# Patient Record
Sex: Male | Born: 1985
Health system: Southern US, Community
[De-identification: ages and names within clinical notes are randomized; demographics above are authoritative.]

## PROBLEM LIST (undated history)

## (undated) DIAGNOSIS — J309 Allergic rhinitis, unspecified: Secondary | ICD-10-CM

## (undated) HISTORY — DX: Allergic rhinitis, unspecified: J30.9

---

## 2011-09-23 ENCOUNTER — Emergency Department (HOSPITAL_COMMUNITY)
Admission: EM | Admit: 2011-09-23 | Discharge: 2011-09-24 | Disposition: A | Discharge status: Home / Self Care | Payer: Self-pay | Attending: Emergency Medicine | Admitting: Emergency Medicine

## 2011-09-23 DIAGNOSIS — W260XXA Contact with knife, initial encounter: Secondary | ICD-10-CM | POA: Insufficient documentation

## 2011-09-23 DIAGNOSIS — S61218A Laceration without foreign body of other finger without damage to nail, initial encounter: Secondary | ICD-10-CM

## 2011-09-23 DIAGNOSIS — S61209A Unspecified open wound of unspecified finger without damage to nail, initial encounter: Secondary | ICD-10-CM | POA: Insufficient documentation

## 2011-09-23 DIAGNOSIS — F17200 Nicotine dependence, unspecified, uncomplicated: Secondary | ICD-10-CM | POA: Insufficient documentation

## 2011-09-24 ENCOUNTER — Emergency Department (HOSPITAL_COMMUNITY): Payer: Self-pay

## 2011-09-24 ENCOUNTER — Encounter (HOSPITAL_COMMUNITY): Payer: Self-pay | Admitting: *Deleted

## 2011-09-24 MED ORDER — ONDANSETRON 4 MG PO TBDP
4.0000 mg | ORAL_TABLET | Freq: Once | ORAL | Status: AC
  Administered 2011-09-24: 4 mg via ORAL

## 2011-09-24 MED ORDER — ONDANSETRON 4 MG PO TBDP
ORAL_TABLET | ORAL | Status: AC
  Filled 2011-09-24: qty 1

## 2011-09-24 NOTE — Discharge Instructions (Signed)
Finger Avulsion  When the tip of the finger is lost, a new nail may grow back if part of the fingernail is left. The new nail may be deformed. If just the tip of the finger is lost, no repair may be needed unless there is bone showing. If bone is showing, your caregiver may need to remove the protruding bone and put on a bandage. Your caregiver will do what is best for you. Most of the time when a fingertip is lost, the end will gradually grow back on and look fairly normal, but it may remain sensitive to pressure and temperature extremes for a long time. HOME CARE INSTRUCTIONS   Keep your hand elevated above your heart to relieve pain and swelling.   Keep your dressing dry and clean.   Change your bandage in 24 hours or as directed.   After your bandage is changed, soak your hand in warm soapy water for 10 to 15 minutes. Do this 3 times per day. This helps reduce pain and swelling.   After soaking your hand, apply a clean, dry bandage. Change your bandage if it is wet or dirty.   Only take over-the-counter or prescription medicines for pain, discomfort, or fever as directed by your caregiver.   See your caregiver as needed for problems.  SEEK MEDICAL CARE IF:   You have increased pain, swelling, drainage, or bleeding.   You have a fever.   You have swelling that spreads from your finger and into your hand.  Make sure to check to see if you need a tetanus booster. Document Released: 06/10/2001 Document Revised: 03/21/2011 Document Reviewed: 05/05/2008    Follow up with Dr. Mina Marble - call his office this week - do not remove the splint or flex the tip of the finger.  Return here sooner with any other concerns.

## 2011-09-24 NOTE — ED Notes (Signed)
Laceration to the  Rt index finger he did with a pocket knife.  Minimal bleeding

## 2011-09-24 NOTE — ED Provider Notes (Signed)
History     CSN: [id number]  Arrival date & time 09/23/11  2346   First MD Initiated Contact with Patient 09/24/11 0100      Chief Complaint  Patient presents with  . Laceration    (Consider location/radiation/quality/duration/timing/severity/associated sxs/prior treatment) HPI Comments: Patient here with laceration to right index finger at the DIP joint - hemostatic - laceration by his own pocket knife - is unable to completely extend from DIP - reports decrease in sensation distal to the injury.  Patient is a 26 y.o. male presenting with skin laceration. The history is provided by the patient. No language interpreter was used.  Laceration  The incident occurred 1 to 2 hours ago. The laceration is located on the right hand. The laceration is 2 cm in size. The laceration mechanism was a a clean knife. The pain is at a severity of 5/10. The pain is moderate. The pain has been constant since onset. He reports no foreign bodies present. His tetanus status is UTD.    History reviewed. No pertinent past medical history.  History reviewed. No pertinent past surgical history.  No family history on file.  History  Substance Use Topics  . Smoking status: Current Everyday Smoker  . Smokeless tobacco: Not on file  . Alcohol Use: No      Review of Systems  Constitutional: Negative for fever and chills.  HENT: Negative for neck pain.   Eyes: Negative for pain.  Respiratory: Negative for chest tightness and shortness of breath.   Cardiovascular: Negative for chest pain.  Gastrointestinal: Negative for nausea, vomiting and abdominal pain.  Genitourinary: Negative for flank pain.  Musculoskeletal: Negative for back pain.  Skin: Positive for wound.  Neurological: Negative for dizziness and headaches.  All other systems reviewed and are negative.    Allergies  Review of patient's allergies indicates no known allergies.  Home Medications   Current Outpatient Rx  Name Route Sig  Dispense Refill  . BUTALBITAL-ASPIRIN-CAFFEINE 50-325-40 MG PO CAPS Oral Take 1-2 capsules by mouth every 4 (four) hours as needed. As needed for headaches. Do not exceed more than 6 capsules per day.    Marland Kitchen LANSOPRAZOLE 15 MG PO CPDR Oral Take 15 mg by mouth daily as needed. As needed for reflux.      BP 139/81  Pulse 88  Temp(Src) 97.7 F (36.5 C) (Oral)  Resp 19  SpO2 95%  Physical Exam  Nursing note and vitals reviewed. Constitutional: He is oriented to person, place, and time. He appears well-developed and well-nourished. No distress.  HENT:  Head: Normocephalic and atraumatic.  Right Ear: External ear normal.  Left Ear: External ear normal.  Nose: Nose normal.  Mouth/Throat: Oropharynx is clear and moist. No oropharyngeal exudate.  Eyes: Conjunctivae are normal. Pupils are equal, round, and reactive to light. No scleral icterus.  Neck: Normal range of motion. Neck supple.  Cardiovascular: Normal rate, regular rhythm and normal heart sounds.  Exam reveals no gallop and no friction rub.   No murmur heard. Pulmonary/Chest: Effort normal and breath sounds normal. No respiratory distress. He has no wheezes. He has no rales. He exhibits no tenderness.  Abdominal: Soft. Bowel sounds are normal. He exhibits no distension. There is no tenderness.  Musculoskeletal: He exhibits tenderness. He exhibits no edema.       1cm hemostatic laceration to dorsum of right index finger at the DIP - sensation dulled distal to injury, able to distinguish pin prick, flexion intact at DIP, unable to extend.  Lymphadenopathy:    He has no cervical adenopathy.  Neurological: He is alert and oriented to person, place, and time. No cranial nerve deficit. He exhibits normal muscle tone. Coordination normal.  Skin: Skin is warm and dry. No rash noted. No erythema. No pallor.  Psychiatric: He has a normal mood and affect. His behavior is normal. Judgment and thought content normal.    ED Course  LACERATION  REPAIR Date/Time: 09/24/2011 2:25 AM Performed by: Marisue Humble, Beryl Hornberger C. Authorized by: Patrecia Pour Consent: Verbal consent obtained. Written consent not obtained. Risks and benefits: risks, benefits and alternatives were discussed Consent given by: patient Patient understanding: patient states understanding of the procedure being performed Patient consent: the patient's understanding of the procedure does not match consent given Procedure consent: procedure consent does not match procedure scheduled Relevant documents: relevant documents not present or verified Test results: test results not available Site marked: the operative site was not marked Imaging studies: imaging studies not available Patient identity confirmed: verbally with patient and arm band Time out: Immediately prior to procedure a "time out" was called to verify the correct patient, procedure, equipment, support staff and site/side marked as required. Body area: upper extremity Location details: right index finger Laceration length: 2 cm Contamination: The wound is contaminated. Foreign bodies: no foreign bodies Tendon involvement: superficial Nerve involvement: superficial Vascular damage: no Anesthesia: digital block Local anesthetic: lidocaine 1% without epinephrine Anesthetic total: 6 ml Patient sedated: no Preparation: Patient was prepped and draped in the usual sterile fashion. Irrigation solution: saline Irrigation method: jet lavage Amount of cleaning: extensive Debridement: none Degree of undermining: none Skin closure: 5-0 nylon Number of sutures: 5 Approximation: close Approximation difficulty: simple Dressing: antibiotic ointment, splint and 4x4 sterile gauze Patient tolerance: Patient tolerated the procedure well with no immediate complications. Comments: Finger splinted in extension - tournicot used during irrigation and laceration repair - total time on 30 minutes   (including critical  care time)  Labs Reviewed - No data to display Dg Finger Index Right  09/24/2011  *RADIOLOGY REPORT*  Clinical Data: Right second finger laceration  RIGHT INDEX FINGER 2+V  Comparison: None.  Findings: No acute fracture or dislocation.  No aggressive osseous lesion.  No radiopaque foreign body. Laceration along the radial- dorsal surface at the DIP joint.  IMPRESSION: No acute osseous abnormality.  Laceration along the dorsal surface at the DIP joint.  Original Report Authenticated By: Waneta Martins, M.D.     Right index finger laceration with extensor tendon laceration   MDM  Patient here with laceration to right index finger with extensor tendon laceration - plan to splint in extension and he will follow up with Dr. Mina Marble.  I have instructed the patient not to remove the finger splint for any reason and with the importance of follow up.  He agrees.  Tetanus is UTD and the knife was a clean knife so I do not think there is a need for antibiotics        Scarlette Calico C. [place], [place] 09/24/11 0231

## 2011-09-24 NOTE — ED Notes (Signed)
Pt refused x-ray and told radiology that he did not think finger was broke.

## 2011-09-25 NOTE — ED Provider Notes (Signed)
Medical screening examination/treatment/procedure(s) were performed by non-physician practitioner and as supervising physician I was immediately available for consultation/collaboration.    Vida Roller, MD 09/25/11 [phone]

## 2012-01-22 DIAGNOSIS — G8929 Other chronic pain: Secondary | ICD-10-CM | POA: Insufficient documentation

## 2012-01-22 DIAGNOSIS — Z720 Tobacco use: Secondary | ICD-10-CM | POA: Insufficient documentation

## 2012-01-22 DIAGNOSIS — G43909 Migraine, unspecified, not intractable, without status migrainosus: Secondary | ICD-10-CM | POA: Insufficient documentation

## 2013-02-11 DIAGNOSIS — J309 Allergic rhinitis, unspecified: Secondary | ICD-10-CM

## 2013-02-11 HISTORY — DX: Allergic rhinitis, unspecified: J30.9

## 2014-02-27 ENCOUNTER — Encounter: Payer: Self-pay | Admitting: *Deleted

## 2014-02-27 ENCOUNTER — Emergency Department (INDEPENDENT_AMBULATORY_CARE_PROVIDER_SITE_OTHER)
Admission: EM | Admit: 2014-02-27 | Discharge: 2014-02-27 | Disposition: A | Discharge status: Home / Self Care | Payer: Medicaid Other | Source: Home / Self Care | Attending: Family Medicine | Admitting: Family Medicine

## 2014-02-27 DIAGNOSIS — M546 Pain in thoracic spine: Secondary | ICD-10-CM

## 2014-02-27 MED ORDER — CYCLOBENZAPRINE HCL 10 MG PO TABS: 10.0000 mg | ORAL_TABLET | Freq: Every evening | ORAL | Status: DC | PRN

## 2014-02-27 MED ORDER — DICLOFENAC SODIUM 50 MG PO TBEC: 50.0000 mg | DELAYED_RELEASE_TABLET | Freq: Two times a day (BID) | ORAL | Status: DC | PRN

## 2014-02-27 MED ORDER — TRAMADOL HCL 50 MG PO TABS: 50.0000 mg | ORAL_TABLET | Freq: Four times a day (QID) | ORAL | Status: DC | PRN

## 2014-02-27 NOTE — ED Provider Notes (Signed)
Howard Fritz is a 28 y.o. male who presents to Urgent Care today for Left back pain. Patient strained his left thoracic back yesterday while lifting a truck frame. The pain is moderate to severe and worse with activity. He has tried Aleve which helps a little. He denies any radiating pain weakness or numbness. No fevers or chills. No neck pain. He feels well otherwise.   History reviewed. No pertinent past medical history. History reviewed. No pertinent past surgical history. History  Substance Use Topics  . Smoking status: Current Every Day Smoker  . Smokeless tobacco: Not on file  . Alcohol Use: No   ROS as above Medications: No current facility-administered medications for this encounter.   Current Outpatient Prescriptions  Medication Sig Dispense Refill  . butalbital-aspirin-caffeine (FIORINAL) 50-325-40 MG per capsule Take 1-2 capsules by mouth every 4 (four) hours as needed. As needed for headaches. Do not exceed more than 6 capsules per day.    . cyclobenzaprine (FLEXERIL) 10 MG tablet Take 1 tablet (10 mg total) by mouth at bedtime as needed for muscle spasms. 20 tablet 0  . diclofenac (VOLTAREN) 50 MG EC tablet Take 1 tablet (50 mg total) by mouth 2 (two) times daily as needed. 60 tablet 0  . lansoprazole (PREVACID) 15 MG capsule Take 15 mg by mouth daily as needed. As needed for reflux.    . traMADol (ULTRAM) 50 MG tablet Take 1 tablet (50 mg total) by mouth every 6 (six) hours as needed. 15 tablet 0   No Known Allergies   Exam:  BP 125/72 mmHg  Pulse 62  Temp(Src) 98 F (36.7 C) (Oral)  Ht 5\' 9"  (1.753 m)  Wt 187 lb 8 oz (85.049 kg)  BMI 27.68 kg/m2  SpO2 98% Gen: Well NAD Back: Nontender along spinal midline from C-spine to L-spine. Tender palpation thoracic paraspinal and latissimus area on the left side. Normal shoulder range of motion. Upper extremity strength reflexes and sensation are intact Lower extremity reflexes are intact and equal throughout. Normal  gait. Back range of motion is normal however patient expresses pain with left rotation  No results found for this or any previous visit (from the past 24 hour(s)). No results found.  Assessment and Plan: 28 y.o. male with thoracic muscle strain. Treatment with NSAIDs and muscle relaxers tramadol heating pad and home exercise program.  Discussed warning signs or symptoms. Please see discharge instructions. Patient expresses understanding.     Rodolph BongEvan S Nieves Chapa, MD 02/27/14 [phone]

## 2014-02-27 NOTE — Discharge Instructions (Signed)
Thank you for coming in today. Come back or go to the emergency room if you notice new weakness new numbness problems walking or bowel or bladder problems.' Use a heating pad.    Back Exercises These exercises may help you when beginning to rehabilitate your injury. Your symptoms may resolve with or without further involvement from your physician, physical therapist or athletic trainer. While completing these exercises, remember:   Restoring tissue flexibility helps normal motion to return to the joints. This allows healthier, less painful movement and activity.  An effective stretch should be held for at least 30 seconds.  A stretch should never be painful. You should only feel a gentle lengthening or release in the stretched tissue. STRETCH - Extension, Prone on Elbows   Lie on your stomach on the floor, a bed will be too soft. Place your palms about shoulder width apart and at the height of your head.  Place your elbows under your shoulders. If this is too painful, stack pillows under your chest.  Allow your body to relax so that your hips drop lower and make contact more completely with the floor.  Hold this position for __________ seconds.  Slowly return to lying flat on the floor. Repeat __________ times. Complete this exercise __________ times per day.  RANGE OF MOTION - Extension, Prone Press Ups   Lie on your stomach on the floor, a bed will be too soft. Place your palms about shoulder width apart and at the height of your head.  Keeping your back as relaxed as possible, slowly straighten your elbows while keeping your hips on the floor. You may adjust the placement of your hands to maximize your comfort. As you gain motion, your hands will come more underneath your shoulders.  Hold this position __________ seconds.  Slowly return to lying flat on the floor. Repeat __________ times. Complete this exercise __________ times per day.  RANGE OF MOTION- Quadruped, Neutral Spine    Assume a hands and knees position on a firm surface. Keep your hands under your shoulders and your knees under your hips. You may place padding under your knees for comfort.  Drop your head and point your tail bone toward the ground below you. This will round out your low back like an angry cat. Hold this position for __________ seconds.  Slowly lift your head and release your tail bone so that your back sags into a large arch, like an old horse.  Hold this position for __________ seconds.  Repeat this until you feel limber in your low back.  Now, find your "sweet spot." This will be the most comfortable position somewhere between the two previous positions. This is your neutral spine. Once you have found this position, tense your stomach muscles to support your low back.  Hold this position for __________ seconds. Repeat __________ times. Complete this exercise __________ times per day.  STRETCH - Flexion, Single Knee to Chest   Lie on a firm bed or floor with both legs extended in front of you.  Keeping one leg in contact with the floor, bring your opposite knee to your chest. Hold your leg in place by either grabbing behind your thigh or at your knee.  Pull until you feel a gentle stretch in your low back. Hold __________ seconds.  Slowly release your grasp and repeat the exercise with the opposite side. Repeat __________ times. Complete this exercise __________ times per day.  STRETCH - Hamstrings, Standing  Stand or sit and  extend your right / left leg, placing your foot on a chair or foot stool  Keeping a slight arch in your low back and your hips straight forward.  Lead with your chest and lean forward at the waist until you feel a gentle stretch in the back of your right / left knee or thigh. (When done correctly, this exercise requires leaning only a small distance.)  Hold this position for __________ seconds. Repeat __________ times. Complete this stretch __________  times per day. STRENGTHENING - Deep Abdominals, Pelvic Tilt   Lie on a firm bed or floor. Keeping your legs in front of you, bend your knees so they are both pointed toward the ceiling and your feet are flat on the floor.  Tense your lower abdominal muscles to press your low back into the floor. This motion will rotate your pelvis so that your tail bone is scooping upwards rather than pointing at your feet or into the floor.  With a gentle tension and even breathing, hold this position for __________ seconds. Repeat __________ times. Complete this exercise __________ times per day.  STRENGTHENING - Abdominals, Crunches   Lie on a firm bed or floor. Keeping your legs in front of you, bend your knees so they are both pointed toward the ceiling and your feet are flat on the floor. Cross your arms over your chest.  Slightly tip your chin down without bending your neck.  Tense your abdominals and slowly lift your trunk high enough to just clear your shoulder blades. Lifting higher can put excessive stress on the low back and does not further strengthen your abdominal muscles.  Control your return to the starting position. Repeat __________ times. Complete this exercise __________ times per day.  STRENGTHENING - Quadruped, Opposite UE/LE Lift   Assume a hands and knees position on a firm surface. Keep your hands under your shoulders and your knees under your hips. You may place padding under your knees for comfort.  Find your neutral spine and gently tense your abdominal muscles so that you can maintain this position. Your shoulders and hips should form a rectangle that is parallel with the floor and is not twisted.  Keeping your trunk steady, lift your right hand no higher than your shoulder and then your left leg no higher than your hip. Make sure you are not holding your breath. Hold this position __________ seconds.  Continuing to keep your abdominal muscles tense and your back steady,  slowly return to your starting position. Repeat with the opposite arm and leg. Repeat __________ times. Complete this exercise __________ times per day. Document Released: 04/19/2005 Document Revised: 06/24/2011 Document Reviewed: 07/14/2008 [facility] Patient Information 2015 [place], [place]. This information is not intended to replace advice given to you by your health care provider. Make sure you discuss any questions you have with your health care provider.

## 2014-02-27 NOTE — ED Notes (Signed)
Pt was lifting and pulling items from behind yesterday and felt a lot of pain like he pulled something on his L side.  Has decreased range of motion and can barely turn his body.  Denies pain when turning head.  Pain is localized to L side and back.  7/10 pain.

## 2014-03-02 ENCOUNTER — Encounter: Payer: Self-pay | Admitting: *Deleted

## 2014-03-02 ENCOUNTER — Emergency Department
Admission: EM | Admit: 2014-03-02 | Discharge: 2014-03-02 | Disposition: A | Discharge status: Home / Self Care | Payer: Medicaid Other | Source: Home / Self Care | Attending: Family Medicine | Admitting: Family Medicine

## 2014-03-02 ENCOUNTER — Emergency Department (INDEPENDENT_AMBULATORY_CARE_PROVIDER_SITE_OTHER): Payer: Medicaid Other

## 2014-03-02 DIAGNOSIS — S29011D Strain of muscle and tendon of front wall of thorax, subsequent encounter: Secondary | ICD-10-CM

## 2014-03-02 DIAGNOSIS — R0781 Pleurodynia: Secondary | ICD-10-CM

## 2014-03-02 DIAGNOSIS — S2341XD Sprain of ribs, subsequent encounter: Secondary | ICD-10-CM

## 2014-03-02 NOTE — Discharge Instructions (Signed)
Wear rib belt daytime.  Apply ice pack for 15 to 20 minutes, 2 to 3 times daily  Continue until pain decreases.  Continue medications.

## 2014-03-02 NOTE — ED Provider Notes (Signed)
CSN: [id number]     Arrival date & time 03/02/14  1342 History   First MD Initiated Contact with Patient 03/02/14 1400     Chief Complaint  Patient presents with  . Back Pain      HPI Comments: Patient was evaluated 3 days ago with left thoracic back strain.  He reports that there has not been any improvement and he has now developed pain in his left lateral chest, worse with movement and inspiration.  He denies shortness of breath   Patient is a 28 y.o. male presenting with chest pain. The history is provided by the patient.  Chest Pain Pain location:  L lateral chest Pain quality: sharp and stabbing   Pain radiates to:  Does not radiate Pain radiates to the back: yes   Pain severity:  Moderate Onset quality:  Gradual Duration:  3 days Timing:  Constant Progression:  Worsening Chronicity:  New Context: breathing, lifting, movement, raising an arm, at rest and trauma   Relieved by:  Nothing Worsened by:  Coughing, deep breathing and movement Ineffective treatments: NSAID and muscle relaxant. Associated symptoms: back pain   Associated symptoms: no abdominal pain, no cough, no diaphoresis, no fever, no palpitations and no shortness of breath     History reviewed. No pertinent past medical history. History reviewed. No pertinent past surgical history. History reviewed. No pertinent family history. History  Substance Use Topics  . Smoking status: Current Every Day Smoker  . Smokeless tobacco: Not on file  . Alcohol Use: No    Review of Systems  Constitutional: Negative for fever and diaphoresis.  Respiratory: Negative for cough and shortness of breath.   Cardiovascular: Positive for chest pain. Negative for palpitations.  Gastrointestinal: Negative for abdominal pain.  Musculoskeletal: Positive for back pain.  All other systems reviewed and are negative.   Allergies  Review of patient's allergies indicates no known allergies.  Home Medications   Prior to Admission  medications   Medication Sig Start Date End Date Taking? Authorizing Provider  butalbital-aspirin-caffeine [facility](FIORINAL) 50-325-40 MG per capsule Take 1-2 capsules by mouth every 4 (four) hours as needed. As needed for headaches. Do not exceed more than 6 capsules per day.    Historical Provider, MD  cyclobenzaprine (FLEXERIL) 10 MG tablet Take 1 tablet (10 mg total) by mouth at bedtime as needed for muscle spasms. 02/27/14   Rodolph BongEvan S Corey, MD  diclofenac (VOLTAREN) 50 MG EC tablet Take 1 tablet (50 mg total) by mouth 2 (two) times daily as needed. 02/27/14   Rodolph BongEvan S Corey, MD  lansoprazole (PREVACID) 15 MG capsule Take 15 mg by mouth daily as needed. As needed for reflux.    Historical Provider, MD  traMADol (ULTRAM) 50 MG tablet Take 1 tablet (50 mg total) by mouth every 6 (six) hours as needed. 02/27/14   Rodolph BongEvan S Corey, MD   BP 132/74 mmHg  Pulse 82  Temp(Src) 98.1 F (36.7 C) (Oral)  Resp 18  Ht 5\' 9"  (1.753 m)  Wt 188 lb (85.276 kg)  BMI 27.75 kg/m2  SpO2 98% Physical Exam  Constitutional: He is oriented to person, place, and time. He appears well-developed and well-nourished. No distress.  HENT:  Head: Atraumatic.  Eyes: Conjunctivae are normal. Pupils are equal, round, and reactive to light.  Neck: Neck supple.  Cardiovascular: Normal heart sounds.   Pulmonary/Chest: Breath sounds normal. No respiratory distress. He has no wheezes. He has no rales.   He exhibits tenderness.  Patient has tenderness over ribs and intercostal muscles left lateral chest as noted on diagram.    Abdominal: There is no tenderness.  Lymphadenopathy:    He has no cervical adenopathy.  Neurological: He is alert and oriented to person, place, and time.  Skin: Skin is warm and dry. No rash noted.  Nursing note and vitals reviewed.   ED Course  Procedures  none  Imaging Review Dg Ribs Unilateral W/chest Left  03/02/2014   CLINICAL DATA:  Left lower lateral rib pain for 4 days after injury 3 days  ago initial evaluation  EXAM: LEFT RIBS AND CHEST - 3+ VIEW  COMPARISON:  None.  FINDINGS: No fracture or other bone lesions are seen involving the ribs. There is no evidence of pneumothorax or pleural effusion. Both lungs are clear. Heart size and mediastinal contours are within normal limits.  IMPRESSION: Negative.   Electronically Signed   By: Esperanza Heiraymond  Rubner M.D.   On: 03/02/2014 14:40     MDM   1. Intercostal muscle strain, subsequent encounter   2. Rib pain on left side    Dispensed rib belt. Wear rib belt daytime.  Apply ice pack for 15 to 20 minutes, 2 to 3 times daily  Continue until pain decreases.  Continue medications. Followup with Dr. Rodney Langtonhomas Thekkekandam ([facility] if not improving about two weeks.     Lattie HawStephen A Chrisanna Mishra, MD 03/04/14 0700

## 2014-03-02 NOTE — ED Notes (Signed)
Howard Fritz reports that his LT side/ back pain is no better.

## 2014-10-29 ENCOUNTER — Emergency Department (INDEPENDENT_AMBULATORY_CARE_PROVIDER_SITE_OTHER)
Admission: EM | Admit: 2014-10-29 | Discharge: 2014-10-29 | Disposition: A | Discharge status: Home / Self Care | Payer: Medicaid Other | Source: Home / Self Care | Attending: Family Medicine | Admitting: Family Medicine

## 2014-10-29 DIAGNOSIS — L299 Pruritus, unspecified: Secondary | ICD-10-CM | POA: Diagnosis not present

## 2014-10-29 MED ORDER — TRIAMCINOLONE ACETONIDE 40 MG/ML IJ SUSP
40.0000 mg | Freq: Once | INTRAMUSCULAR | Status: AC
  Administered 2014-10-29: 40 mg via INTRAMUSCULAR

## 2014-10-29 MED ORDER — DIPHENHYDRAMINE HCL 50 MG/ML IJ SOLN
50.0000 mg | Freq: Once | INTRAMUSCULAR | Status: AC
  Administered 2014-10-29: 50 mg via INTRAMUSCULAR

## 2014-10-29 NOTE — Discharge Instructions (Signed)
Take Benadryl  every 6 hours until symptoms resolved.  Ensure adequate hydration.  Stay out of heat today. If symptoms become significantly worse during the night or over the weekend, proceed to the local emergency room.

## 2014-10-29 NOTE — ED Provider Notes (Signed)
CSN: [id number]     Arrival date & time 10/29/14  1156 History   First MD Initiated Contact with Patient 10/29/14 1220     Chief Complaint  Patient presents with  . Allergic Reaction      HPI Comments: About 2.5 hours ago while preparing to eat breakfast in a local restaurant, patient suddenly felt flushed and began itching all over.  No swelling in mouth or throat.  No wheezing or shortness of breath. He notes that his hands feel slightly swollen and palms are erythematous.  His father notes that his face was red earlier.  No known contact with allergens, and he had not yet eaten this morning.  Patient is a 29 y.o. male presenting with rash. The history is provided by the patient and a parent.  Rash Location:  Full body Quality: itchiness   Severity:  Mild Onset quality:  Sudden Duration:  2 hours Timing:  Constant Progression:  Improving Chronicity:  New Context: not animal contact, not chemical exposure, not exposure to similar rash, not food, not hot tub use, not insect bite/sting, not medications, not new detergent/soap, not nuts, not plant contact, not pollen, not sick contacts and not sun exposure   Relieved by:  None tried Worsened by:  Nothing tried Ineffective treatments:  None tried Associated symptoms: no abdominal pain, no fatigue, no fever, no headaches, no joint pain, no myalgias, no nausea, no periorbital edema, no shortness of breath, no sore throat, no throat swelling, no tongue swelling, no URI and not wheezing     History reviewed. No pertinent past medical history. History reviewed. No pertinent past surgical history. No family history on file. History  Substance Use Topics  . Smoking status: Current Every Day Smoker  . Smokeless tobacco: Not on file  . Alcohol Use: No    Review of Systems  Constitutional: Negative for fever and fatigue.  HENT: Negative for sore throat.   Respiratory: Negative for shortness of breath and wheezing.   Gastrointestinal:  Negative for nausea and abdominal pain.  Musculoskeletal: Negative for myalgias and arthralgias.  Skin: Positive for rash.  Neurological: Negative for headaches.  All other systems reviewed and are negative.   Allergies  Review of patient's allergies indicates no known allergies.  Home Medications   Prior to Admission medications   Medication Sig Start Date End Date Taking? Authorizing Provider  ranitidine (ZANTAC) 300 MG tablet Take 300 mg by mouth at bedtime.   Yes Historical Provider, MD   BP 131/74 mmHg  Pulse 72  Temp(Src) 97.7 F (36.5 C) (Oral)  Ht 5\' 9"  (1.753 m)  Wt 188 lb (85.276 kg)  BMI 27.75 kg/m2  SpO2 97% Physical Exam Nursing notes and Vital Signs reviewed. Appearance:  Patient appears stated age, and in no acute distress Eyes:  Pupils are equal, round, and reactive to light and accomodation.  Extraocular movement is intact.  Conjunctivae are not inflamed  Ears:  Canals normal.  Tympanic membranes normal.  Nose:  Mildly congested turbinates.  No sinus tenderness.    Mouth/Pharynx:  Normal Neck:  Supple.  No adenopathy Lungs:  Clear to auscultation.  Breath sounds are equal.  Moving air well. Heart:  Regular rate and rhythm without murmurs, rubs, or gallops.  Abdomen:  Nontender without masses or hepatosplenomegaly.  Bowel sounds are present.  No CVA or flank tenderness.  Extremities:  No edema.  Skin:   Faintly erythematous neck and palms.  ED Course  Procedures  none  MDM  1. Pruritus ?etiology    Benadryl  IM, and Kenalog  IM Take Benadryl  every 6 hours until symptoms resolved.  Ensure adequate hydration.  Stay out of heat today. If symptoms become significantly worse during the night or over the weekend, proceed to the local emergency room.     Lattie Haw, MD 10/29/14 [phone]

## 2014-10-29 NOTE — ED Notes (Signed)
Patient states that approx about a hour ago he had a sudden reaction and started itching and swelling. Has not eaten anything or touched anything to his knowledge.

## 2015-05-11 ENCOUNTER — Ambulatory Visit (INDEPENDENT_AMBULATORY_CARE_PROVIDER_SITE_OTHER): Payer: 59 | Admitting: Family Medicine

## 2015-05-11 ENCOUNTER — Encounter: Payer: Self-pay | Admitting: Family Medicine

## 2015-05-11 ENCOUNTER — Telehealth: Payer: Self-pay

## 2015-05-11 VITALS — BP 134/77 | HR 64 | Ht 70.0 in | Wt 187.0 lb

## 2015-05-11 DIAGNOSIS — R51 Headache: Secondary | ICD-10-CM | POA: Diagnosis not present

## 2015-05-11 DIAGNOSIS — F17210 Nicotine dependence, cigarettes, uncomplicated: Secondary | ICD-10-CM | POA: Diagnosis not present

## 2015-05-11 DIAGNOSIS — G4700 Insomnia, unspecified: Secondary | ICD-10-CM | POA: Diagnosis not present

## 2015-05-11 DIAGNOSIS — G8929 Other chronic pain: Secondary | ICD-10-CM

## 2015-05-11 DIAGNOSIS — Z720 Tobacco use: Secondary | ICD-10-CM

## 2015-05-11 MED ORDER — SUMATRIPTAN SUCCINATE 50 MG PO TABS: 50.0000 mg | ORAL_TABLET | ORAL | Status: DC | PRN

## 2015-05-11 MED ORDER — VARENICLINE TARTRATE 0.5 MG X 11 & 1 MG X 42 PO MISC: ORAL | Status: DC

## 2015-05-11 MED ORDER — METOCLOPRAMIDE HCL 10 MG PO TABS: 10.0000 mg | ORAL_TABLET | Freq: Three times a day (TID) | ORAL | Status: DC | PRN

## 2015-05-11 NOTE — Telephone Encounter (Signed)
Received prior authorization for Chantix sent through cover my meds waiting on authorization. - CF

## 2015-05-11 NOTE — Progress Notes (Signed)
Howard Fritz is a 30 y.o. male who presents to [name]: Primary Care today for establish care and smoking cessation.  Smoking cessation: patient has 11 year 1-1.5 ppd (11-16 pack years) history of smoking. He wanted to quit 5 years ago with birth of his son but was unable to do so. He has a daughter due any day now and absolutely wants to quit now. Several coworkers and family members have quit recently and used Chantix, with success. Patient interested in starting that now. Patient notes he smoke up until he goes to bed but never in the house or around his wife. Patient has tried quitting cold [place] 6-7 times in the last 2 years but was unsuccessful due to irritability.   Insomia: Patient has 5 year history of difficulty falling asleep and staying asleep. Patient notes he has an unpredictable work schedule that has made this worse in the last 2.5 years due to restriction of sleep, need to occasionally work over night and having differing sleep and wake times. Patient has a glass of tea at lunch and a doctor pepper at night and smokes right up until bed. Patient does not spend time in bed watching TV. 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 Patient notes a history of severe ADHD on medication until he was 24. He feels like his mind is racing off medication though he would not like to restart medication because it affected his appetite and sleep.  Headache: Patient notes 2 year history of severe 10/10 sudden onset headaches that last 3-4 minutes feeling like a band around his head as well as retroorbital pain causing lacrimation. He has had 7-8 in the past year. These necessitate lying down which will resolve the headache. One headache in November was severe and lengthy enough to go to the ER due to pain so severe he was in the fetal  position crying, given IV medications and sent home. Patient denies nausea vomiting phono/photophobia. HA have no clear precipitant and do not occur in any particular season. Patient can sense headaches coming on, though he does not have specific symptoms that he notes. He has not had any vision changes.    History reviewed. No pertinent past medical history. History reviewed. No pertinent past surgical history. Social History  Substance Use Topics  . Smoking status: Current Every Day Smoker  . Smokeless tobacco: Not on file  . Alcohol Use: No   family history is not on file.  ROS as above Medications: Current Outpatient Prescriptions  Medication Sig Dispense Refill  . metoCLOPramide (REGLAN) 10 MG tablet Take 1 tablet (10 mg total) by mouth every 8 (eight) hours as needed (headache). 30 tablet 0  . ranitidine (ZANTAC) 300 MG tablet Take 300 mg by mouth at bedtime.    . SUMAtriptan (IMITREX) 50 MG tablet Take 1 tablet (50 mg total) by mouth every 2 (two) hours as needed for migraine. May repeat in 2 hours if headache persists or recurs. 10 tablet 1  . varenicline (CHANTIX STARTING MONTH PAK) 0.5 MG X 11 & 1 MG X 42 tablet Take one 0.5mg  tablet by mouth once daily for 3 days, then increase to one 0.5mg  tablet twice daily for 3 days, then increase to one  tablet twice daily. 53 tablet 0   No current facility-administered medications for this visit.   Allergies  Allergen Reactions  .  Hydrocodone-Acetaminophen     nausea     Exam:  BP 134/77 mmHg  Pulse 64  Ht  (1.778 m)  Wt 187 lb (84.823 kg)  BMI 26.83 kg/m2 Gen: Well appearing man in NAD HEENT: EOMI,  MMM, optic disks crisp  Lungs: Normal work of breathing. CTABL Heart: RRR no MRG Abd: NABS, Soft. Nondistended, Nontender  Neuro: CN2-12 intact  Exts: Brisk capillary refill, warm and well perfused.  Psych: occasionally inattentive throughout interview, fidgets frequently, found to have gotten up and moving around the  room after left in the room 3 separate times  No results found for this or any previous visit (from the past 24 hour(s)). No results found.  PHQ-9: 8 GAD-7: 4  Please see individual assessment and plan sections.   >10 minutes spent on smoking cessation counseling

## 2015-05-11 NOTE — Patient Instructions (Signed)
Thank you for coming in today. You were seen today for headaches, insomnia and quit smoking. For the smoking, start Chantix. You may experience irritability. For the HA, we will prescribe medication to take if you feel one coming on.  For the insomnia, please follow the instructions below and we will reevaluate in 1 month. We will get basic screening  bloodwork today.  Please come back in 1 month.   Insomnia Insomnia is a sleep disorder that makes it difficult to fall asleep or to stay asleep. Insomnia can cause tiredness (fatigue), low energy, difficulty concentrating, mood swings, and poor performance at work or school.  There are three different ways to classify insomnia:  Difficulty falling asleep.  Difficulty staying asleep.  Waking up too early in the morning. Any type of insomnia can be long-term (chronic) or short-term (acute). Both are common. Short-term insomnia usually lasts for three months or less. Chronic insomnia occurs at least three times a week for longer than three months. CAUSES  Insomnia may be caused by another condition, situation, or substance, such as:  Anxiety.  Certain medicines.  Gastroesophageal reflux disease (GERD) or other gastrointestinal conditions.  Asthma or other breathing conditions.  Restless legs syndrome, sleep apnea, or other sleep disorders.  Chronic pain.  Menopause. This may include hot flashes.  Stroke.  Abuse of alcohol, tobacco, or illegal drugs.  Depression.  Caffeine.   Neurological disorders, such as Alzheimer disease.  An overactive thyroid (hyperthyroidism). The cause of insomnia may not be known. RISK FACTORS Risk factors for insomnia include:  Gender. Women are more commonly affected than men.  Age. Insomnia is more common as you get older.  Stress. This may involve your professional or personal life.  Income. Insomnia is more common in people with lower income.  Lack of exercise.   Irregular work  schedule or night shifts.  Traveling between different time zones. SIGNS AND SYMPTOMS If you have insomnia, trouble falling asleep or trouble staying asleep is the main symptom. This may lead to other symptoms, such as:  Feeling fatigued.  Feeling nervous about going to sleep.  Not feeling rested in the morning.  Having trouble concentrating.  Feeling irritable, anxious, or depressed. TREATMENT  Treatment for insomnia depends on the cause. If your insomnia is caused by an underlying condition, treatment will focus on addressing the condition. Treatment may also include:   Medicines to help you sleep.  Counseling or therapy.  Lifestyle adjustments. HOME CARE INSTRUCTIONS   Take medicines only as directed by your health care provider.  Keep regular sleeping and waking hours. Avoid naps.  Keep a sleep diary to help you and your health care provider figure out what could be causing your insomnia. Include:   When you sleep.  When you wake up during the night.  How well you sleep.   How rested you feel the next day.  Any side effects of medicines you are taking.  What you eat and drink.   Make your bedroom a comfortable place where it is easy to fall asleep:  Put up shades or special blackout curtains to block light from outside.  Use a white noise machine to block noise.  Keep the temperature cool.   Exercise regularly as directed by your health care provider. Avoid exercising right before bedtime.  Use relaxation techniques to manage stress. Ask your health care provider to suggest some techniques that may work well for you. These may include:  Breathing exercises.  Routines to release muscle  tension.  Visualizing peaceful scenes.  Cut back on alcohol, caffeinated beverages, and cigarettes, especially close to bedtime. These can disrupt your sleep.  Do not overeat or eat spicy foods right before bedtime. This can lead to digestive discomfort that can  make it hard for you to sleep.  Limit screen use before bedtime. This includes:  Watching TV.  Using your smartphone, tablet, and computer.  Stick to a routine. This can help you fall asleep faster. Try to do a quiet activity, brush your teeth, and go to bed at the same time each night.  Get out of bed if you are still awake after 15 minutes of trying to sleep. Keep the lights down, but try reading or doing a quiet activity. When you feel sleepy, go back to bed.  Make sure that you drive carefully. Avoid driving if you feel very sleepy.  Keep all follow-up appointments as directed by your health care provider. This is important. SEEK MEDICAL CARE IF:   You are tired throughout the day or have trouble in your daily routine due to sleepiness.  You continue to have sleep problems or your sleep problems get worse. SEEK IMMEDIATE MEDICAL CARE IF:   You have serious thoughts about hurting yourself or someone else.   This information is not intended to replace advice given to you by your health care provider. Make sure you discuss any questions you have with your health care provider.   Document Released: 03/29/2000 Document Revised: 12/21/2014 Document Reviewed: 12/31/2013 Elsevier Interactive Patient Education [employer]

## 2015-05-11 NOTE — Assessment & Plan Note (Addendum)
Will start Chantix given patient preference. Counseled patient extensively on side effects. FOllow up in 1 month to see efficacy and medication tolerance.

## 2015-05-11 NOTE — Assessment & Plan Note (Addendum)
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 Provided information on ways to improve sleep hygiene. Encouraged no caffeine or cigarettes after noon. GAD7 and PHQ9 unremarkable, plan to assess in the future. Will follow up in 1 month after smoking cessation.

## 2015-05-11 NOTE — Assessment & Plan Note (Addendum)
Acute headaches are unknown etiology, could be in the family of cluster headaches given the symptoms, though the duration, frequency and pattern make these less likely. Chronic sinus infections likely secondary to smoking as well as seasonal allergies could be contributing as well as insomnia issues likely contributing to the issue of chronic headaches. Given abortive treatment given infrequency of headaches. Will reassess in 1 month after smoking cessation.

## 2015-05-19 ENCOUNTER — Telehealth: Payer: Self-pay | Admitting: Family Medicine

## 2015-05-19 NOTE — Telephone Encounter (Signed)
Received information back from insurance company, Rx was denied until Pt tries and fails their preferred formulary. Will put information in ordering providers box for review.  

## 2015-05-19 NOTE — Telephone Encounter (Signed)
Resubmitted prior authorization on Chantix through cover my meds with the new information from Dr. Denyse Amass waiting on authorization. - CF

## 2015-05-22 NOTE — Telephone Encounter (Signed)
Received fax from [employer] and they have approved coverage on Chantix from 05/19/2015 - 05/18/2016. File ID [id number]. Pharmacy has been notified. - CF

## 2015-06-08 ENCOUNTER — Ambulatory Visit: Payer: 59 | Admitting: Family Medicine

## 2015-06-15 ENCOUNTER — Ambulatory Visit (INDEPENDENT_AMBULATORY_CARE_PROVIDER_SITE_OTHER): Payer: 59 | Admitting: Family Medicine

## 2015-06-15 DIAGNOSIS — Z720 Tobacco use: Secondary | ICD-10-CM

## 2015-06-16 NOTE — Progress Notes (Signed)
No show

## 2015-07-29 ENCOUNTER — Emergency Department (INDEPENDENT_AMBULATORY_CARE_PROVIDER_SITE_OTHER)
Admission: EM | Admit: 2015-07-29 | Discharge: 2015-07-29 | Disposition: A | Discharge status: Home / Self Care | Payer: 59 | Source: Home / Self Care | Attending: Family Medicine | Admitting: Family Medicine

## 2015-07-29 ENCOUNTER — Encounter: Payer: Self-pay | Admitting: Emergency Medicine

## 2015-07-29 ENCOUNTER — Emergency Department (INDEPENDENT_AMBULATORY_CARE_PROVIDER_SITE_OTHER): Payer: 59

## 2015-07-29 DIAGNOSIS — R05 Cough: Secondary | ICD-10-CM | POA: Diagnosis not present

## 2015-07-29 DIAGNOSIS — J209 Acute bronchitis, unspecified: Secondary | ICD-10-CM

## 2015-07-29 MED ORDER — CLARITHROMYCIN 500 MG PO TABS: 500.0000 mg | ORAL_TABLET | Freq: Two times a day (BID) | ORAL | Status: DC

## 2015-07-29 NOTE — Discharge Instructions (Signed)
Take plain guaifenesin (1200mg  extended release tabs such as Mucinex) twice daily, with plenty of water, for cough and congestion.    Stop amoxicillin.  Finish prednisone.  Stop Tessalon perles (benzonatate) daytime.  May continue codeine cough syrup at bedtime. Stop all antihistamines for now, and other non-prescription cough/cold preparations. May take Ibuprofen 200mg , 4 tabs every 8 hours with food for chest/sternum discomfort.   Follow-up with family doctor if not improving about one week.

## 2015-07-29 NOTE — ED Notes (Signed)
Pt c/o cough x 2 weeks, worse when laying down.  Pt has tried antbs and prednisone no better

## 2015-07-29 NOTE — ED Provider Notes (Signed)
CSN: [id number]     Arrival date & time 07/29/15  1025 History   First MD Initiated Contact with Patient 07/29/15 1130     Chief Complaint  Patient presents with  . Cough      HPI Comments: Patient has had sinus congestion for about 2 months.  He developed a non-productive cough about 2 weeks ago and was treated with amoxicillin, Tessalon perles, and a prednisone taper pack.  He complains of persistent cough, often coughing till he gags.  Last week he had fever 99 to 100, and this week has had sweats only.  No pleuritic pain or shortness of breath.  His last Tdap was 4 years ago. He continues to smoke.  The history is provided by the patient.    History reviewed. No pertinent past medical history. History reviewed. No pertinent past surgical history. No family history on file. Social History  Substance Use Topics  . Smoking status: Current Every Day Smoker  . Smokeless tobacco: None  . Alcohol Use: No    Review of Systems No sore throat + cough No pleuritic pain No wheezing No nasal congestion ? post-nasal drainage No sinus pain/pressure No itchy/red eyes No earache No hemoptysis No SOB + low grade fever, + chills/sweats No nausea No vomiting No abdominal pain No diarrhea No urinary symptoms No skin rash + fatigue No myalgias No headache Used OTC meds without relief  Allergies  Hydrocodone-acetaminophen  Home Medications   Prior to Admission medications   Medication Sig Start Date End Date Taking? Authorizing Provider  Amoxicillin (AMOXIL PO) Take by mouth.   Yes Historical Provider, MD  PREDNISONE, PAK, PO Take by mouth.   Yes Historical Provider, MD  clarithromycin (BIAXIN) 500 MG tablet Take 1 tablet (500 mg total) by mouth 2 (two) times daily. Take with food. 07/29/15   Lattie Haw, MD   Meds Ordered and Administered this Visit  Medications - No data to display  BP 121/76 mmHg  Pulse 59  Temp(Src) 97.8 F (36.6 C) (Oral)  Ht  (1.753 m)  Wt  196 lb (88.905 kg)  BMI 28.93 kg/m2  SpO2 97% No data found.   Physical Exam Nursing notes and Vital Signs reviewed. Appearance:  Patient appears stated age, and in no acute distress Eyes:  Pupils are equal, round, and reactive to light and accomodation.  Extraocular movement is intact.  Conjunctivae are not inflamed  Ears:  Canals normal.  Tympanic membranes normal.  Nose:  Mildly congested turbinates.  No sinus tenderness.  Pharynx:  Normal Neck:  Supple.  Nontender enlarged posterior/lateral nodes are palpated bilaterally  Lungs:  Clear to auscultation.  Breath sounds are equal.  Moving air well. Heart:  Regular rate and rhythm without murmurs, rubs, or gallops.  Abdomen:  Nontender without masses or hepatosplenomegaly.  Bowel sounds are present.  No CVA or flank tenderness.  Extremities:  No edema.  Skin:  No rash present.   ED Course  Procedures none   Imaging Review Dg Chest 2 View  07/29/2015  CLINICAL DATA:  Cough for the past 2 weeks. EXAM: CHEST  2 VIEW COMPARISON:  03/02/2014. FINDINGS: Normal sized heart. Clear lungs. Mild central peribronchial thickening. Normal appearing bones. IMPRESSION: Mild bronchitic changes. Electronically Signed   By: Beckie Salts M.D.   On: 07/29/2015 12:33       MDM   1. Acute bronchitis, unspecified organism; ?pertussis   Begin Biaxin  BID for atypical coverage. Take plain guaifenesin (  extended  release tabs such as Mucinex) twice daily, with plenty of water, for cough and congestion.    Stop amoxicillin.  Finish prednisone.  Stop Tessalon perles (benzonatate) daytime.  May continue codeine cough syrup at bedtime. Stop all antihistamines for now, and other non-prescription cough/cold preparations. May take Ibuprofen 200mg , 4 tabs every 8 hours with food for chest/sternum discomfort.   Follow-up with family doctor if not improving about one week.    Lattie HawStephen A Beese, MD 07/31/15 [phone]

## 2015-12-19 ENCOUNTER — Encounter: Payer: Self-pay | Admitting: Family Medicine

## 2015-12-19 ENCOUNTER — Ambulatory Visit (INDEPENDENT_AMBULATORY_CARE_PROVIDER_SITE_OTHER): Payer: 59 | Admitting: Family Medicine

## 2015-12-19 VITALS — BP 135/73 | HR 95 | Temp 99.0°F | Resp 18 | Wt 190.0 lb

## 2015-12-19 DIAGNOSIS — J40 Bronchitis, not specified as acute or chronic: Secondary | ICD-10-CM

## 2015-12-19 DIAGNOSIS — R0602 Shortness of breath: Secondary | ICD-10-CM | POA: Diagnosis not present

## 2015-12-19 DIAGNOSIS — Z720 Tobacco use: Secondary | ICD-10-CM | POA: Diagnosis not present

## 2015-12-19 MED ORDER — PROMETHAZINE-CODEINE 6.25-10 MG/5ML PO SYRP: 5.0000 mL | ORAL_SOLUTION | Freq: Every evening | ORAL | 0 refills | Status: DC | PRN

## 2015-12-19 MED ORDER — PREDNISONE 10 MG PO TABS: 30.0000 mg | ORAL_TABLET | Freq: Every day | ORAL | 0 refills | Status: DC

## 2015-12-19 MED ORDER — IPRATROPIUM-ALBUTEROL 0.5-2.5 (3) MG/3ML IN SOLN
3.0000 mL | Freq: Four times a day (QID) | RESPIRATORY_TRACT | Status: DC
  Administered 2015-12-19: 3 mL via RESPIRATORY_TRACT

## 2015-12-19 MED ORDER — IPRATROPIUM BROMIDE 0.06 % NA SOLN: 2.0000 | NASAL | 6 refills | Status: DC | PRN

## 2015-12-19 MED ORDER — CEFDINIR 300 MG PO CAPS: 300.0000 mg | ORAL_CAPSULE | Freq: Two times a day (BID) | ORAL | 0 refills | Status: DC

## 2015-12-19 NOTE — Patient Instructions (Signed)
Thank you for coming in today. Call or go to the emergency room if you get worse, have trouble breathing, have chest pains, or palpitations.     Acute Bronchitis Bronchitis is inflammation of the airways that extend from the windpipe into the lungs (bronchi). The inflammation often causes mucus to develop. This leads to a cough, which is the most common symptom of bronchitis.  In acute bronchitis, the condition usually develops suddenly and goes away over time, usually in a couple weeks. Smoking, allergies, and asthma can make bronchitis worse. Repeated episodes of bronchitis may cause further lung problems.  CAUSES Acute bronchitis is most often caused by the same virus that causes a cold. The virus can spread from person to person (contagious) through coughing, sneezing, and touching contaminated objects. SIGNS AND SYMPTOMS   Cough.   Fever.   Coughing up mucus.   Body aches.   Chest congestion.   Chills.   Shortness of breath.   Sore throat.  DIAGNOSIS  Acute bronchitis is usually diagnosed through a physical exam. Your health care provider will also ask you questions about your medical history. Tests, such as chest X-rays, are sometimes done to rule out other conditions.  TREATMENT  Acute bronchitis usually goes away in a couple weeks. Oftentimes, no medical treatment is necessary. Medicines are sometimes given for relief of fever or cough. Antibiotic medicines are usually not needed but may be prescribed in certain situations. In some cases, an inhaler may be recommended to help reduce shortness of breath and control the cough. A cool mist vaporizer may also be used to help thin bronchial secretions and make it easier to clear the chest.  HOME CARE INSTRUCTIONS  Get plenty of rest.   Drink enough fluids to keep your urine clear or pale yellow (unless you have a medical condition that requires fluid restriction). Increasing fluids may help thin your respiratory  secretions (sputum) and reduce chest congestion, and it will prevent dehydration.   Take medicines only as directed by your health care provider.  If you were prescribed an antibiotic medicine, finish it all even if you start to feel better.  Avoid smoking and secondhand smoke. Exposure to cigarette smoke or irritating chemicals will make bronchitis worse. If you are a smoker, consider using nicotine gum or skin patches to help control withdrawal symptoms. Quitting smoking will help your lungs heal faster.   Reduce the chances of another bout of acute bronchitis by washing your hands frequently, avoiding people with cold symptoms, and trying not to touch your hands to your mouth, nose, or eyes.   Keep all follow-up visits as directed by your health care provider.  SEEK MEDICAL CARE IF: Your symptoms do not improve after 1 week of treatment.  SEEK IMMEDIATE MEDICAL CARE IF:  You develop an increased fever or chills.   You have chest pain.   You have severe shortness of breath.  You have bloody sputum.   You develop dehydration.  You faint or repeatedly feel like you are going to pass out.  You develop repeated vomiting.  You develop a severe headache. MAKE SURE YOU:   Understand these instructions.  Will watch your condition.  Will get help right away if you are not doing well or get worse.   This information is not intended to replace advice given to you by your health care provider. Make sure you discuss any questions you have with your health care provider.   Document Released: 05/09/2004 Document Revised: 04/22/2014   Document Reviewed: 09/22/2012 Elsevier Interactive Patient Education 2016 [employer] Inc[employer]  

## 2015-12-19 NOTE — Progress Notes (Signed)
       Howard Fritz is a 30 y.o. male who [facility] today for cough congestion shortness of breath sinus pain and pressure. Symptoms present for 3 days. Patient notes subjective fevers and chills. He denies any vomiting or diarrhea or chest pains. He's tried some over-the-counter medications which have helped a bit. He feels well otherwise.   History reviewed. No pertinent past medical history. History reviewed. No pertinent surgical history. Social History  Substance Use Topics  . Smoking status: Current Every Day Smoker  . Smokeless tobacco: Not on file  . Alcohol use No   family history is not on file.  ROS as above:  Medications: Current Outpatient Prescriptions  Medication Sig Dispense Refill  . cefdinir (OMNICEF) 300 MG capsule Take 1 capsule (300 mg total) by mouth 2 (two) times daily. 14 capsule 0  . ipratropium (ATROVENT) 0.06 % nasal spray Place 2 sprays into both nostrils every 4 (four) hours as needed for rhinitis. 10 mL 6  . predniSONE (DELTASONE) 10 MG tablet Take 3 tablets (30 mg total) by mouth daily with breakfast. 15 tablet 0  . promethazine-codeine (PHENERGAN WITH CODEINE) 6.25-10 MG/5ML syrup Take 5 mLs by mouth at bedtime as needed for cough. 120 mL 0   Current Facility-Administered Medications  Medication Dose Route Frequency Provider Last Rate Last Dose  . ipratropium-albuterol (DUONEB) 0.5-2.5 (3) MG/3ML nebulizer solution 3 mL  3 mL Nebulization Q6H Rodolph BongEvan S Lynnelle Mesmer, MD   3 mL at 12/19/15 1352   Allergies  Allergen Reactions  . Hydrocodone-Acetaminophen     nausea     Exam:  BP 135/73 (BP Location: Right Arm, Patient Position: Sitting, Cuff Size: Normal)   Pulse 95   Temp 99 F (37.2 C) (Oral)   Resp 18   Wt 190 lb 0.6 oz (86.2 kg)   SpO2 100%   BMI 28.06 kg/m  Gen: Well NAD Nontoxic appearing HEENT: EOMI,  MMM clear  nasal discharge. Normal tympanic membranes bilaterally Lungs: Normal work of breathing. Coarse breath sounds bilaterally Heart: RRR no MRG Abd: NABS, Soft. Nondistended, Nontender Exts: Brisk capillary refill, warm and well perfused.   Patient was given a 2.5/0.5 mg DuoNeb nebulizer treatment and did not feel much better.  No results found for this or any previous visit (from the past 24 hour(s)). No results found.    Assessment and Plan: 30 y.o. male with bronchitis versus viral URI versus possible sinus infection. Treat empirically with prednisone, Omnicef, Atrovent nasal spray, and codeine/Phenergan cough syrup. Return as needed.   No orders of the defined types were placed in this encounter.   Discussed warning signs or symptoms. Please see discharge instructions. Patient expresses understanding.

## 2015-12-19 NOTE — Progress Notes (Signed)
Pt started with runny nose Saturday.  It has progressed to body aches, head aches, fever, pain below the eyes, coughing that keeps him up at night.  Has been taking advil and nyquil, but doesn't seem to be helping.

## 2016-04-16 IMAGING — CR DG RIBS W/ CHEST 3+V*L*
3 series · 3 of 3 positions shown · non-contrast
Comparison: None.

CLINICAL DATA: Left lower lateral rib pain for 4 days after injury
3 days ago initial evaluation

EXAM:
LEFT RIBS AND CHEST - 3+ VIEW

[view not recorded (1 of 3)]
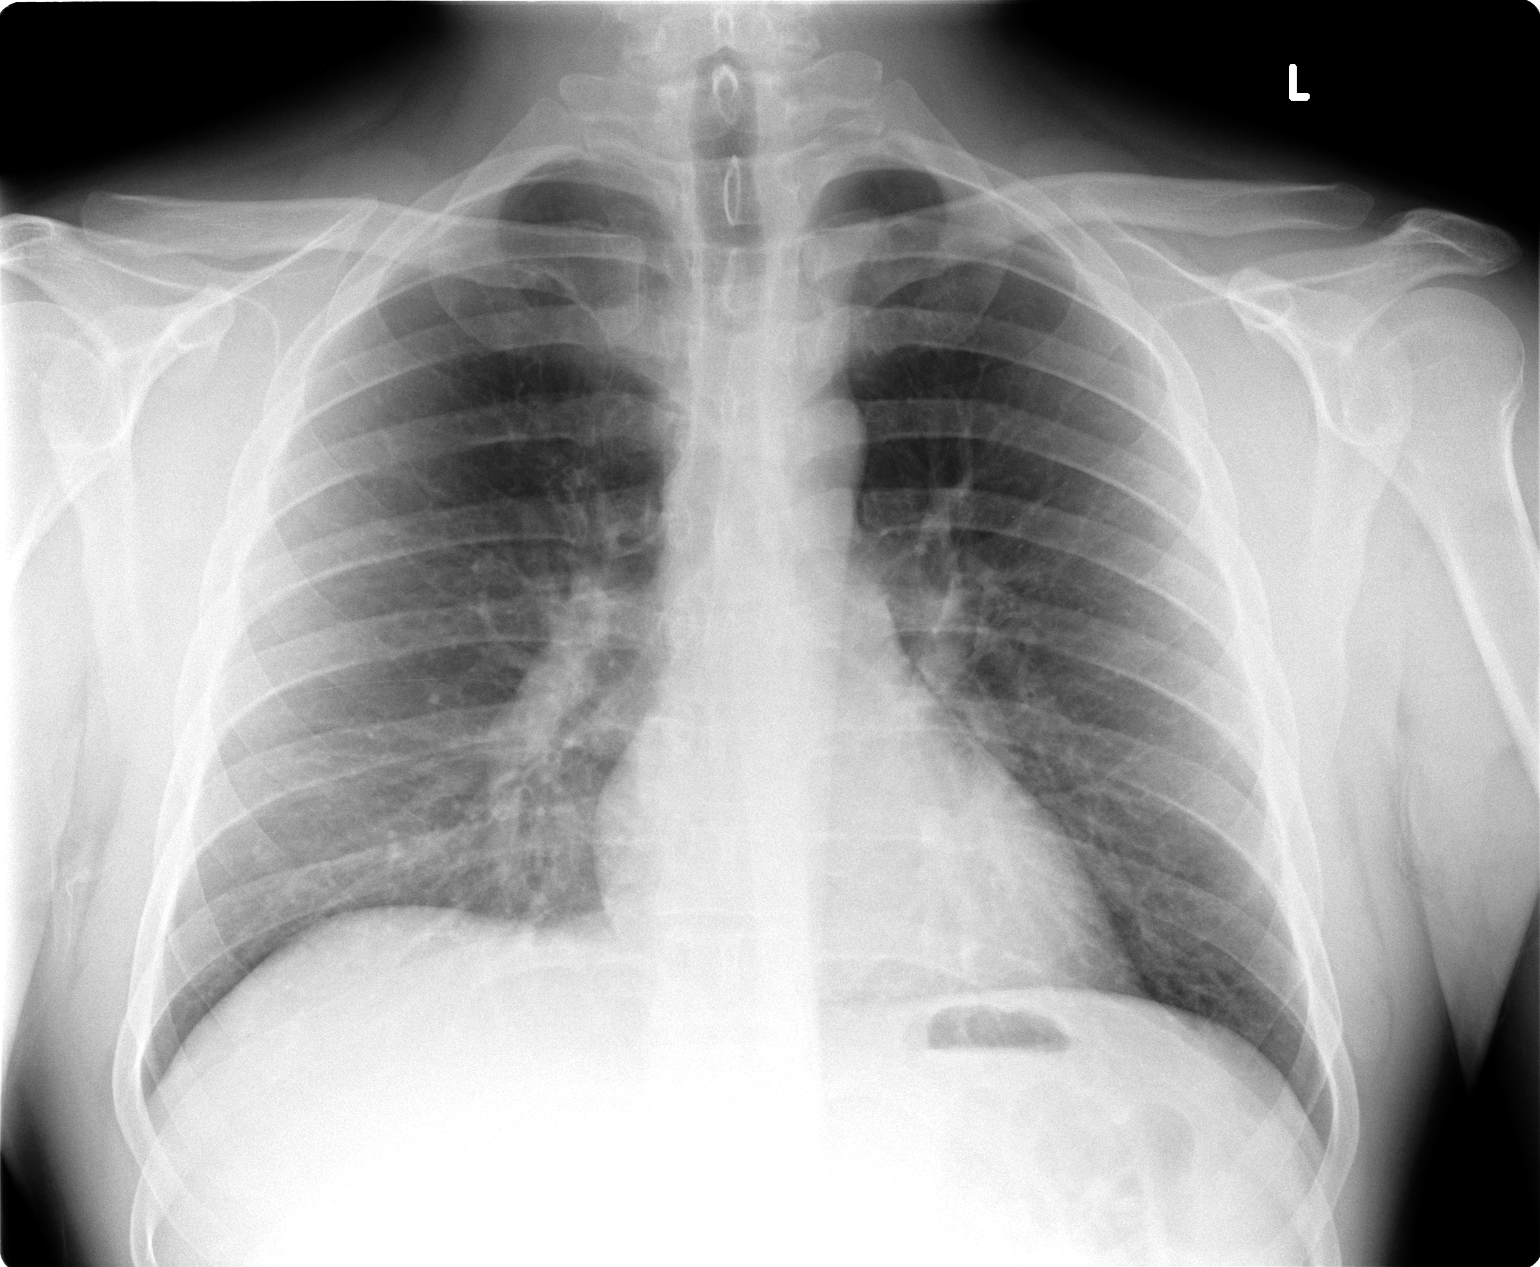

[view not recorded (2 of 3)]
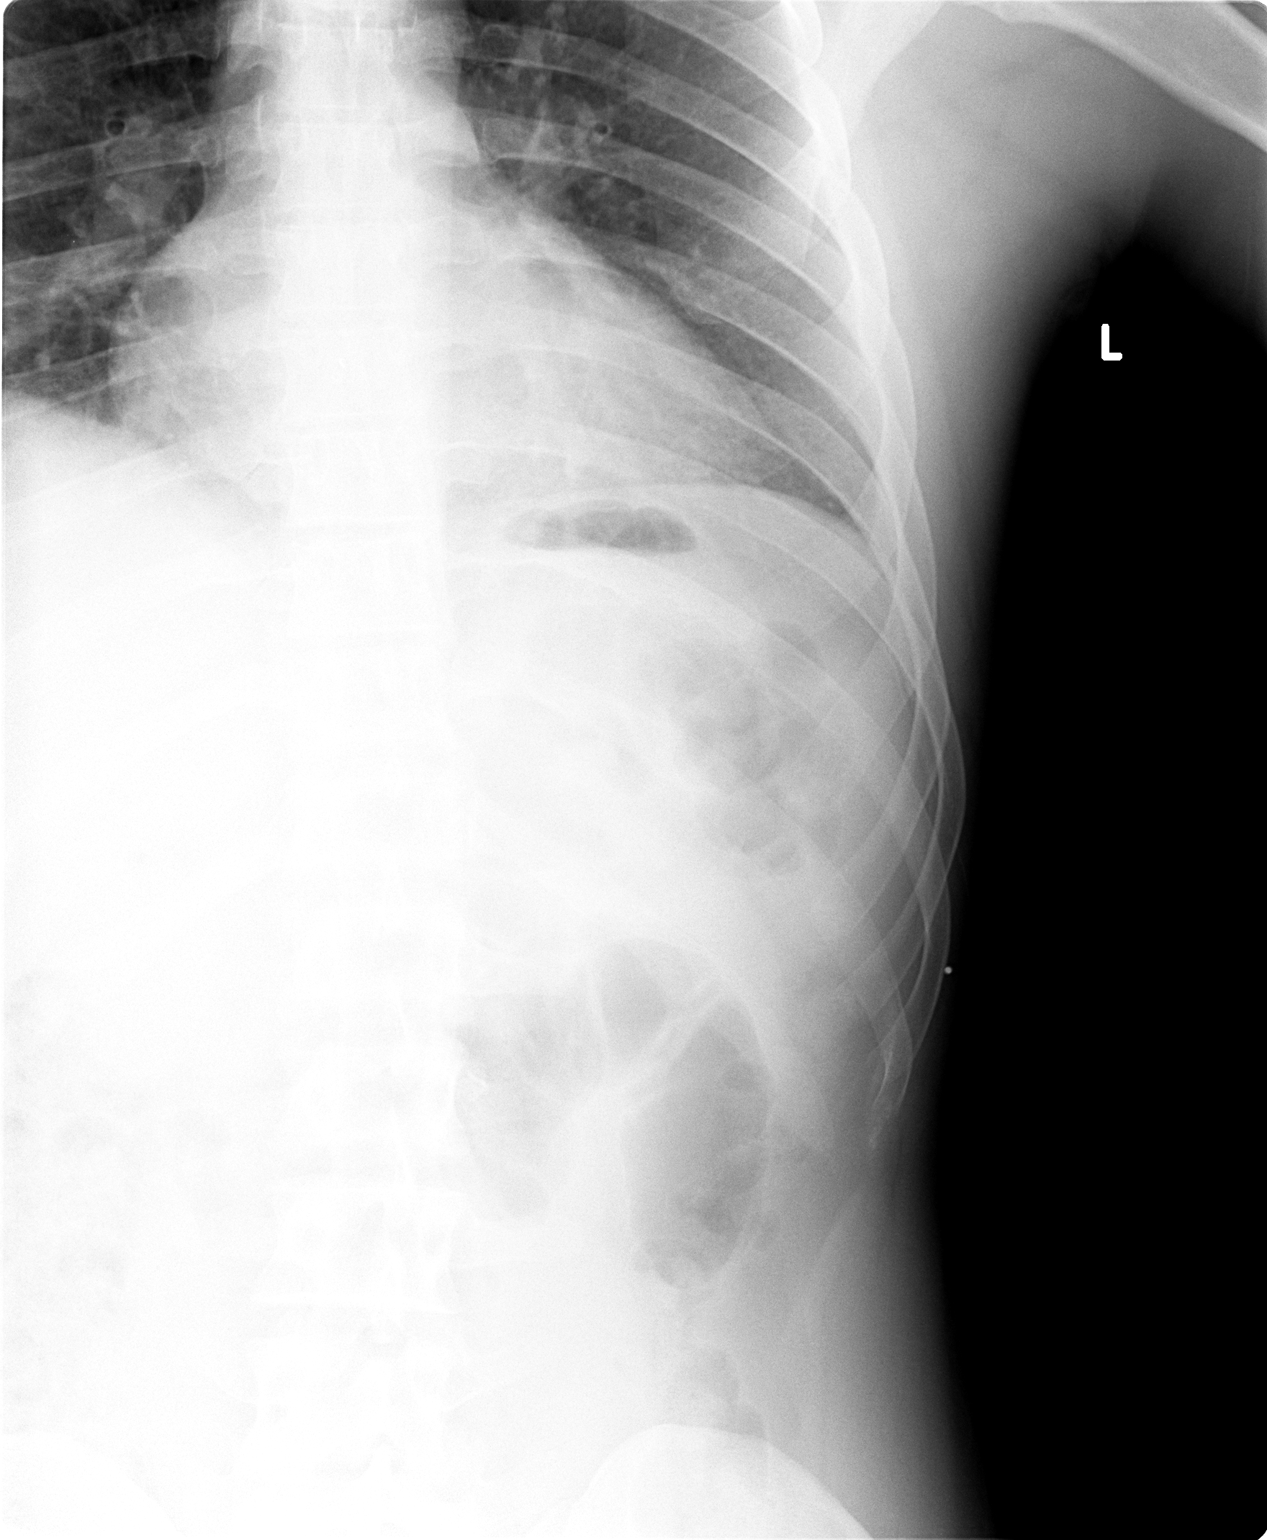

[view not recorded (3 of 3)]
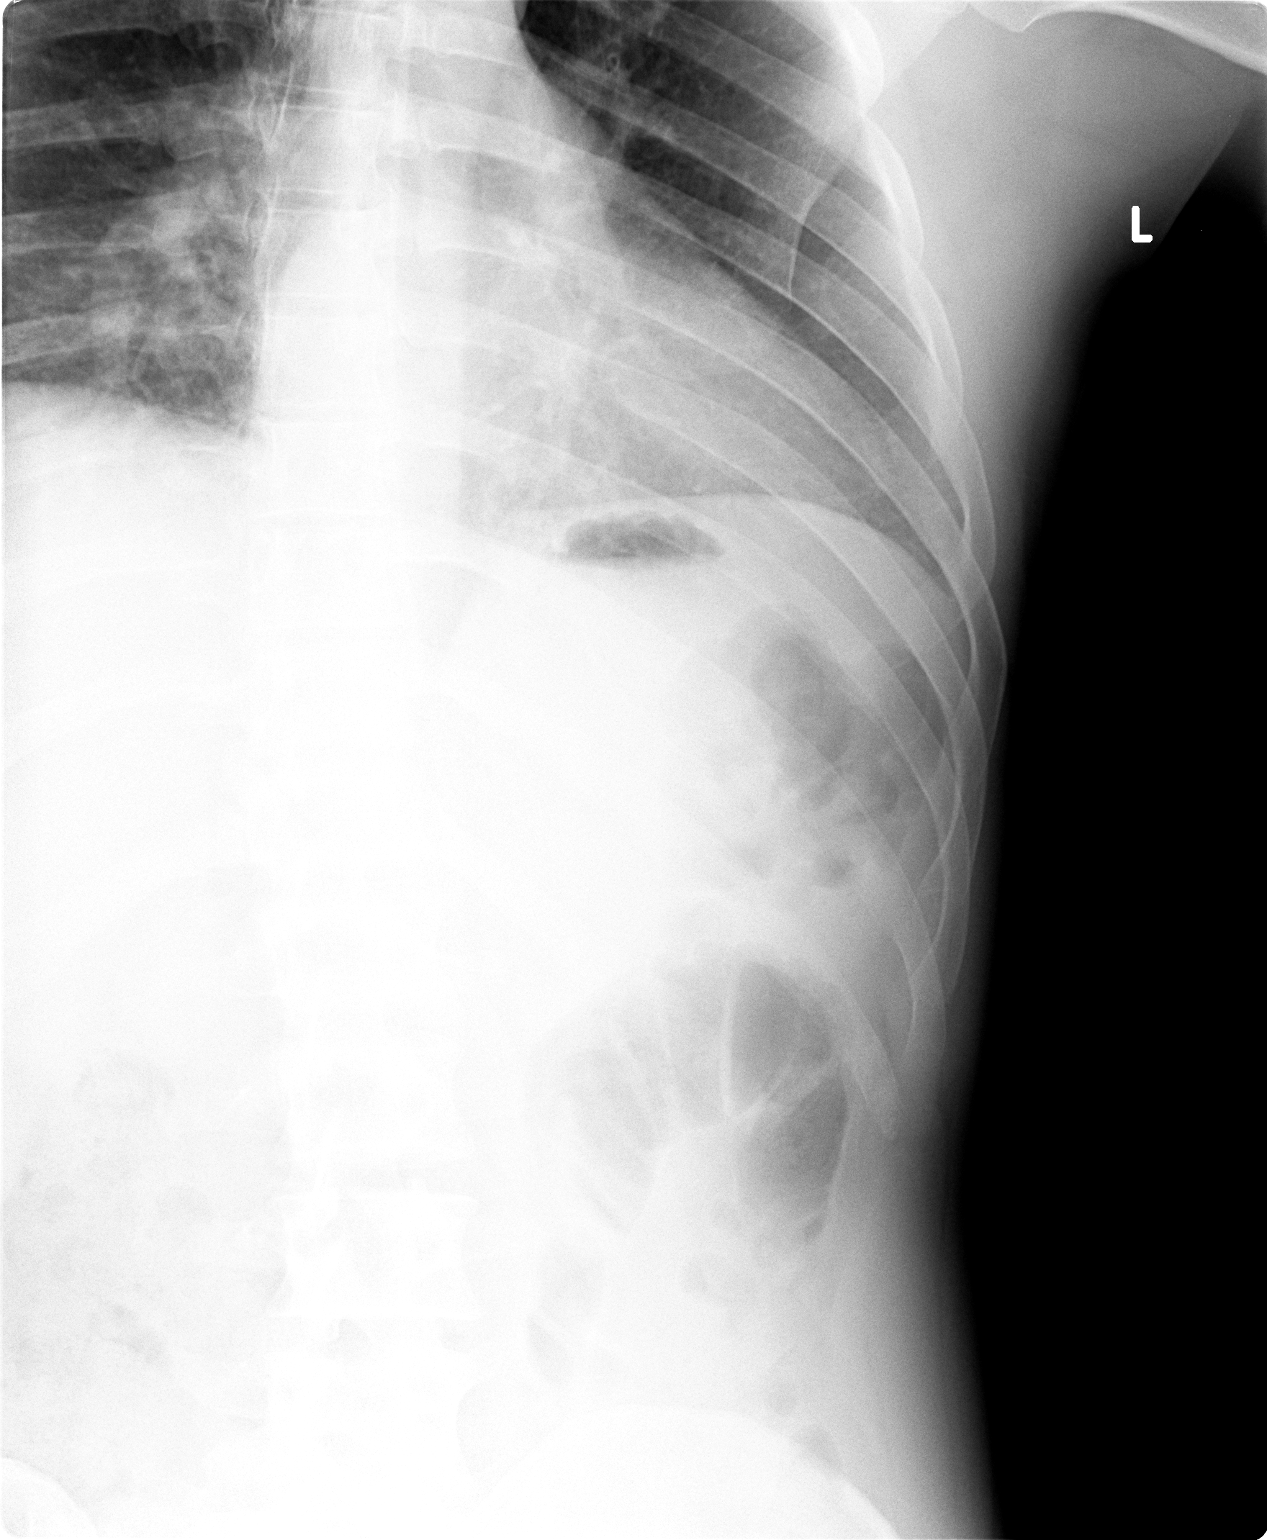

[3 of 3 positions shown; findings below may reference images not displayed]

FINDINGS: No fracture or other bone lesions are seen involving the ribs. There
is no evidence of pneumothorax or pleural effusion. Both lungs are
clear. Heart size and mediastinal contours are within normal limits.
IMPRESSION: Negative.

## 2016-07-17 ENCOUNTER — Encounter: Payer: Self-pay | Admitting: Family Medicine

## 2016-07-17 ENCOUNTER — Ambulatory Visit (INDEPENDENT_AMBULATORY_CARE_PROVIDER_SITE_OTHER): Payer: 59 | Admitting: Family Medicine

## 2016-07-17 DIAGNOSIS — J358 Other chronic diseases of tonsils and adenoids: Secondary | ICD-10-CM | POA: Diagnosis not present

## 2016-07-17 MED ORDER — AMOXICILLIN 500 MG PO CAPS: 500.0000 mg | ORAL_CAPSULE | Freq: Three times a day (TID) | ORAL | 0 refills | Status: DC

## 2016-07-17 NOTE — Progress Notes (Signed)
       Howard Fritz is a 31 y.o. male who presents to [name] today for tonsil growth. Patient notes a several day history of mild sore throat and a whitish area on his right tonsil. This is associated with enlarged tonsil and some gagging. He feels well with no fevers or chills.   No past medical history on file. No past surgical history on file. Social History  Substance Use Topics  . Smoking status: Current Every Day Smoker  . Smokeless tobacco: Never Used  . Alcohol use No   family history is not on file.  ROS as above:  Medications: Current Outpatient Prescriptions  Medication Sig Dispense Refill  . amoxicillin (AMOXIL) 500 MG capsule Take 1 capsule (500 mg total) by mouth 3 (three) times daily. 21 capsule 0   No current facility-administered medications for this visit.    Allergies  Allergen Reactions  . Hydrocodone-Acetaminophen     nausea    Health Maintenance Health Maintenance  Topic Date Due  . HIV Screening  07/24/2002  . INFLUENZA VACCINE  11/13/2016  . TETANUS/TDAP  09/13/2021     Exam:  BP 136/70   Pulse 83   Temp 98.6 F (37 C) (Oral)   Wt 190 lb (86.2 kg)   SpO2 98%   BMI 28.06 kg/m  Gen: Well NAD HEENT: EOMI,  MMM Right tonsillar mild hypertrophy with erythema and exudate and a medium tonsil stone Lungs: Normal work of breathing. CTABL Heart: RRR no MRG Abd: NABS, Soft. Nondistended, Nontender Exts: Brisk capillary refill, warm and well perfused.    No results found for this or any previous visit (from the past 72 hour(s)). No results found.    Assessment and Plan: 31 y.o. male with mild tonsillitis with a tonsil stone. Treat with amoxicillin. Recommend warm water gargles and trying to express it at home. If he is unsuccessful or not improving or recommend he call back and we'll refer to ENT.   No orders of the  defined types were placed in this encounter.  Meds ordered this encounter  Medications  . amoxicillin (AMOXIL) 500 MG capsule    Sig: Take 1 capsule (500 mg total) by mouth 3 (three) times daily.    Dispense:  21 capsule    Refill:  0     Discussed warning signs or symptoms. Please see discharge instructions. Patient expresses understanding.

## 2016-07-17 NOTE — Patient Instructions (Signed)
Thank you for coming in today. Take amoxicillin three times daily for 1 week.  Use hot water gargles.  Try to get the tonsil stone expressed.   Return or call if not better We can refer to ENT.   This is a Tonsil Stone Tonsillitis Tonsillitis is an infection of the throat that causes the tonsils to become red, tender, and swollen. Tonsils are collections of lymphoid tissue at the back of the throat. Each tonsil has crevices (crypts). Tonsils help fight nose and throat infections and keep infection from spreading to other parts of the body for the first 18 months of life. What are the causes? Sudden (acute) tonsillitis is usually caused by infection with streptococcal bacteria. Long-lasting (chronic) tonsillitis occurs when the crypts of the tonsils become filled with pieces of food and bacteria, which makes it easy for the tonsils to become repeatedly infected. What are the signs or symptoms? Symptoms of tonsillitis include:  A sore throat, with possible difficulty swallowing.  White patches on the tonsils.  Fever.  Tiredness.  New episodes of snoring during sleep, when you did not snore before.  Small, foul-smelling, yellowish-white pieces of material (tonsilloliths) that you occasionally cough up or spit out. The tonsilloliths can also cause you to have bad breath. How is this diagnosed? Tonsillitis can be diagnosed through a physical exam. Diagnosis can be confirmed with the results of lab tests, including a throat culture. How is this treated? The goals of tonsillitis treatment include the reduction of the severity and duration of symptoms and prevention of associated conditions. Symptoms of tonsillitis can be improved with the use of steroids to reduce the swelling. Tonsillitis caused by bacteria can be treated with antibiotic medicines. Usually, treatment with antibiotic medicines is started before the cause of the tonsillitis is known. However, if it is determined that the cause  is not bacterial, antibiotic medicines will not treat the tonsillitis. If attacks of tonsillitis are severe and frequent, your health care provider may recommend surgery to remove the tonsils (tonsillectomy). Follow these instructions at home:  Rest as much as possible and get plenty of sleep.  Drink plenty of fluids. While the throat is very sore, eat soft foods or liquids, such as sherbet, soups, or instant breakfast drinks.  Eat frozen ice pops.  Gargle with a warm or cold liquid to help soothe the throat. Mix 1/4 teaspoon of salt and 1/4 teaspoon of baking soda in 8 oz of water. Contact a health care provider if:  Large, tender lumps develop in your neck.  A rash develops.  A green, yellow-brown, or bloody substance is coughed up.  You are unable to swallow liquids or food for 24 hours.  You notice that only one of the tonsils is swollen. Get help right away if:  You develop any new symptoms such as vomiting, severe headache, stiff neck, chest pain, or trouble breathing or swallowing.  You have severe throat pain along with drooling or voice changes.  You have severe pain, unrelieved with recommended medications.  You are unable to fully open the mouth.  You develop redness, swelling, or severe pain anywhere in the neck.  You have a fever. This information is not intended to replace advice given to you by your health care provider. Make sure you discuss any questions you have with your health care provider. Document Released: 01/09/2005 Document Revised: 09/07/2015 Document Reviewed: 09/18/2012 Elsevier Interactive Patient Education  2017 [employer].

## 2016-09-25 ENCOUNTER — Encounter: Payer: Self-pay | Admitting: Family Medicine

## 2016-09-25 ENCOUNTER — Ambulatory Visit (INDEPENDENT_AMBULATORY_CARE_PROVIDER_SITE_OTHER): Payer: 59 | Admitting: Family Medicine

## 2016-09-25 VITALS — BP 121/74 | HR 81 | Wt 188.0 lb

## 2016-09-25 DIAGNOSIS — M25511 Pain in right shoulder: Secondary | ICD-10-CM

## 2016-09-25 MED ORDER — MELOXICAM 15 MG PO TABS: 15.0000 mg | ORAL_TABLET | Freq: Every day | ORAL | 0 refills | Status: DC

## 2016-09-25 MED ORDER — CYCLOBENZAPRINE HCL 10 MG PO TABS: 10.0000 mg | ORAL_TABLET | Freq: Three times a day (TID) | ORAL | 0 refills | Status: DC | PRN

## 2016-09-25 MED ORDER — OXYCODONE-ACETAMINOPHEN 5-325 MG PO TABS: 1.0000 | ORAL_TABLET | Freq: Three times a day (TID) | ORAL | 0 refills | Status: DC | PRN

## 2016-09-25 NOTE — Progress Notes (Signed)
Howard Fritz is a 31 y.o. male who presents to [facility] today for right shoulder pain. Patient was in his normal state of health until several days ago. He was swinging a hammer and missed several times. This resulted in excessive internal rotation and straight across his back. He did not have any significant pain during and immediately after the exercise. However 2 days later (Monday) he developed intense pain across his mid back and into his right shoulder. He denies any radiating pain weakness or numbness. He notes significant pain in his thoracic back and lateral upper arm especially with overhead motion reaching back. He's tried over-the-counter ibuprofen and some leftover Flexeril which helped a little. 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   Past Medical History:  Diagnosis Date  . Allergic rhinitis 02/11/2013   No past surgical history on file. Social History  Substance Use Topics  . Smoking status: Current Every Day Smoker  . Smokeless tobacco: Never Used  . Alcohol use No     ROS:  As above   Medications: Current Outpatient Prescriptions  Medication Sig Dispense Refill  . cyclobenzaprine (FLEXERIL) 10 MG tablet Take 1 tablet (10 mg total) by mouth 3 (three) times daily as needed for muscle spasms. 30 tablet 0  . meloxicam (MOBIC) 15 MG tablet Take 1 tablet (15 mg total) by mouth daily. 14 tablet 0  . oxyCODONE-acetaminophen (ROXICET) 5-325 MG tablet Take 1 tablet by mouth every 8 (eight) hours as needed for severe pain. 12 tablet 0   No current facility-administered medications for this visit.    Allergies  Allergen Reactions  . Hydrocodone-Acetaminophen     nausea     Exam:  BP 121/74   Pulse 81   Wt 188 lb (85.3 kg)   BMI 27.76 kg/m  General: Well Developed, well nourished, and in no acute distress.  Neuro/Psych: Alert and oriented x3, extra-ocular muscles intact, able to move all  4 extremities, sensation grossly intact. Skin: Warm and dry, no rashes noted.  Respiratory: Not using accessory muscles, speaking in full sentences, trachea midline.  Cardiovascular: Pulses palpable, no extremity edema. Abdomen: Does not appear distended. MSK:  C-spine: Nontender to midline normal neck motion. Thoracic spine nontender to midline. Tender palpation right rhomboid and periscapular muscle groups. Tender palpation right trapezius. Right shoulder normal-appearing nontender Range of motion normal external rotation. Abduction active limited to 100 by pain. Forward flexion is also limited to 100 by pain. Internal rotation is limited to the lumbar spine. Passive motion is full but painful. Negative Hawkins and Neer's test. External rotation strength is intact but painful. Internal rotation strength is intact but painful. Abduction strength 4+/5 right compared to left with pain. Negative Yergason's and speeds test. Negative O'Brien test. Negative crossover arm compression test. Pulses capillary refill and sensation intact distally.     No results found for this or any previous visit (from the past 48 hour(s)). No results found.    Assessment and Plan: 31 y.o. male with right shoulder pain. Likely rhomboid and trapezius strain associated with mild rotator cuff strain. Doubtful for full thickness rotator cuff tear. Plan for multiple treatment approach including meloxicam Flexeril and limited oxycodone for pain control. Refer to physical therapy and use TENS unit a heating pad as well. Work note provided. Recheck in 1 week or sooner if worsening or not improving.  [facility] [place] [place].  Orders Placed This Encounter  Procedures  . Ambulatory referral to Physical Therapy    Referral Priority:   Routine    Referral Type:   Physical Medicine    Referral Reason:   Specialty Services Required    Requested Specialty:    Physical Therapy    Number of Visits Requested:   1   Meds ordered this encounter  Medications  . cyclobenzaprine (FLEXERIL) 10 MG tablet    Sig: Take 1 tablet (10 mg total) by mouth 3 (three) times daily as needed for muscle spasms.    Dispense:  30 tablet    Refill:  0  . meloxicam (MOBIC) 15 MG tablet    Sig: Take 1 tablet (15 mg total) by mouth daily.    Dispense:  14 tablet    Refill:  0  . oxyCODONE-acetaminophen (ROXICET) 5-325 MG tablet    Sig: Take 1 tablet by mouth every 8 (eight) hours as needed for severe pain.    Dispense:  12 tablet    Refill:  0    Discussed warning signs or symptoms. Please see discharge instructions. Patient expresses understanding.

## 2016-09-25 NOTE — Patient Instructions (Signed)
Thank you for coming in today. Attend PT.  Use a heating pad.  Use a TENS unit.   Take meloxicam during the day.  Use Oxycodone sparingly at night.  Use flexeril at night as needed.   Recheck in 1 week or less if not getting a lot better.   Out of work as needed.   TENS UNIT: This is helpful for muscle pain and spasm.   Search and Purchase a TENS 7000 2nd edition at  www.tenspros.com or www.Amazon.com It should be less than $30.     TENS unit instructions: Do not shower or bathe with the unit on Turn the unit off before removing electrodes or batteries If the electrodes lose stickiness add a drop of water to the electrodes after they are disconnected from the unit and place on plastic sheet. If you continued to have difficulty, call the TENS unit company to purchase more electrodes. Do not apply lotion on the skin area prior to use. Make sure the skin is clean and dry as this will help prolong the life of the electrodes. After use, always check skin for unusual red areas, rash or other skin difficulties. If there are any skin problems, does not apply electrodes to the same area. Never remove the electrodes from the unit by pulling the wires. Do not use the TENS unit or electrodes other than as directed. Do not change electrode placement without consultating your therapist or physician. Keep 2 fingers with between each electrode. Wear time ratio is 2:1, on to off times.    For example on for 30 minutes off for 15 minutes and then on for 30 minutes off for 15 minutes

## 2016-09-26 ENCOUNTER — Ambulatory Visit: Payer: 59 | Admitting: Rehabilitative and Restorative Service Providers"

## 2016-10-22 ENCOUNTER — Telehealth: Payer: Self-pay | Admitting: Family Medicine

## 2016-10-22 MED ORDER — VARENICLINE TARTRATE 0.5 MG X 11 & 1 MG X 42 PO MISC: ORAL | 0 refills | Status: DC

## 2016-10-22 NOTE — Telephone Encounter (Signed)
Patient called and states that he seen Dr.Corey a while back ago for Chantix Rx and he never got it or started it and would like to get another RX. Please call patient on cell and let him know IF you will let him try this? Thanks

## 2016-10-22 NOTE — Telephone Encounter (Signed)
Unable to leave a message, voicemail is not set up.  

## 2016-10-22 NOTE — Telephone Encounter (Signed)
Ok to start. New rx will be faxed to [facility] [address]

## 2016-10-23 NOTE — Telephone Encounter (Signed)
Tried both #'s listed unable to lvm. Will close this note since this is almost 1 day old and its for prescription

## 2016-10-28 ENCOUNTER — Telehealth: Payer: Self-pay

## 2016-10-28 NOTE — Telephone Encounter (Signed)
PA Approved:  Request Reference Number: [id number]. CHANTIX PAK 0.5& 1MG  is approved through 10/23/2017.

## 2016-10-28 NOTE — Telephone Encounter (Signed)
Submitted PA via CoverMyMeds for chantix, awaiting decision.

## 2017-03-05 ENCOUNTER — Ambulatory Visit (INDEPENDENT_AMBULATORY_CARE_PROVIDER_SITE_OTHER): Payer: BLUE CROSS/BLUE SHIELD

## 2017-03-05 ENCOUNTER — Other Ambulatory Visit: Payer: Self-pay | Admitting: Family Medicine

## 2017-03-05 ENCOUNTER — Ambulatory Visit (INDEPENDENT_AMBULATORY_CARE_PROVIDER_SITE_OTHER): Payer: BLUE CROSS/BLUE SHIELD | Admitting: Family Medicine

## 2017-03-05 ENCOUNTER — Encounter: Payer: Self-pay | Admitting: Family Medicine

## 2017-03-05 VITALS — BP 120/71 | HR 63 | Temp 98.1°F | Resp 16 | Ht 69.0 in | Wt 188.9 lb

## 2017-03-05 DIAGNOSIS — M25562 Pain in left knee: Secondary | ICD-10-CM | POA: Diagnosis not present

## 2017-03-05 DIAGNOSIS — R52 Pain, unspecified: Secondary | ICD-10-CM

## 2017-03-05 DIAGNOSIS — S8991XA Unspecified injury of right lower leg, initial encounter: Secondary | ICD-10-CM | POA: Diagnosis not present

## 2017-03-05 DIAGNOSIS — R51 Headache: Secondary | ICD-10-CM

## 2017-03-05 DIAGNOSIS — M25561 Pain in right knee: Secondary | ICD-10-CM

## 2017-03-05 DIAGNOSIS — S8992XA Unspecified injury of left lower leg, initial encounter: Secondary | ICD-10-CM | POA: Diagnosis not present

## 2017-03-05 DIAGNOSIS — G8929 Other chronic pain: Secondary | ICD-10-CM

## 2017-03-05 MED ORDER — BUTALBITAL-APAP-CAFFEINE 50-325-40 MG PO TABS: 1.0000 | ORAL_TABLET | Freq: Four times a day (QID) | ORAL | 0 refills | Status: DC | PRN

## 2017-03-05 MED ORDER — NAPROXEN 500 MG PO TABS: 500.0000 mg | ORAL_TABLET | Freq: Two times a day (BID) | ORAL | 3 refills | Status: AC

## 2017-03-05 MED ORDER — SUMATRIPTAN SUCCINATE 50 MG PO TABS: 50.0000 mg | ORAL_TABLET | ORAL | 0 refills | Status: DC | PRN

## 2017-03-05 NOTE — Progress Notes (Signed)
Howard Fritz is a 31 y.o. male who presents to [facility] today for right knee injury.  Patient fell out of a trailer twisting his right knee about a week ago.  He developed pain and swelling.  He continues to experience pain especially at the medial aspect of his right knee.  He notes that he is limping and unable to work.  He denies locking or catching but does feel quite symptomatic.  He has been using ibuprofen and ice which have helped.  Additionally he has a history of headache disorders.  He has done well in the past with Fioricet and sumatriptan and to use intermittently for headache prophylaxis.  He notes he typically will take these about once a month and needs a refill.   Past Medical History:  Diagnosis Date  . Allergic rhinitis 02/11/2013   History reviewed. No pertinent surgical history. Social History   Tobacco Use  . Smoking status: Current Every Day Smoker  . Smokeless tobacco: Never Used  Substance Use Topics  . Alcohol use: No     ROS:  As above   Medications: Current Outpatient Medications  Medication Sig Dispense Refill  . butalbital-acetaminophen-caffeine (FIORICET, ESGIC) 50-325-40 MG tablet Take 1-2 tablets by mouth every 6 (six) hours as needed for headache. 20 tablet 0  . naproxen (NAPROSYN) 500 MG tablet Take 1 tablet (500 mg total) by mouth 2 (two) times daily with a meal. 60 tablet 3  . SUMAtriptan (IMITREX) 50 MG tablet Take 1 tablet (50 mg total) by mouth every 2 (two) hours as needed for migraine. May repeat in 2 hours if headache persists or recurs. 30 tablet 0  . varenicline (CHANTIX STARTING MONTH PAK) 0.5 MG X 11 & 1 MG X 42 tablet Take one 0.5mg  tablet by mouth once daily for 3 days, then increase to one 0.5mg  tablet twice daily for 3 days, then increase to one 1mg  tablet twice daily. (Patient not taking: Reported on 03/05/2017) 53 tablet 0   No current facility-administered medications for this  visit.    Allergies  Allergen Reactions  . Hydrocodone-Acetaminophen     nausea     Exam:  BP 120/71   Pulse 63   Temp 98.1 F (36.7 C) (Oral)   Resp 16   Ht 5\' 9"  (1.753 m)   Wt 188 lb 14.4 oz (85.7 kg)   SpO2 98%   BMI 27.90 kg/m  General: Well Developed, well nourished, and in no acute distress.  Neuro/Psych: Alert and oriented x3, extra-ocular muscles intact, able to move all 4 extremities, sensation grossly intact. Skin: Warm and dry, no rashes noted.  Respiratory: Not using accessory muscles, speaking in full sentences, trachea midline.  Cardiovascular: Pulses palpable, no extremity edema. Abdomen: Does not appear distended. MSK: Right knee normal-appearing without effusion. Tender to palpation medial joint line and at the posterior aspect of the knee. Range of motion 0-100 degrees. Laxity with anterior drawer testing solid posterior drawer testing. Stable MCL and LCL stress test. Positive medial McMurray's test. Antalgic gait present    No results found for this or any previous visit (from the past 48 hour(s)). Dg Knee 1-2 Views Left  Result Date: 03/05/2017 CLINICAL DATA:  Fall 3 days ago.  Posterior knee pain. EXAM: LEFT KNEE - 1-2 VIEW COMPARISON:  None. FINDINGS: No acute bony abnormality. Specifically, no fracture, subluxation, or dislocation. Soft tissues are intact. IMPRESSION: Negative. Electronically Signed   By: Charlett NoseKevin  Dover M.D.  On: 03/05/2017 11:43   Dg Knee Complete 4 Views Right  Result Date: 03/05/2017 CLINICAL DATA:  Fall from trailer 3 days ago.  Posterior knee pain. EXAM: RIGHT KNEE - COMPLETE 4+ VIEW COMPARISON:  None. FINDINGS: No evidence of fracture, dislocation, or joint effusion. No evidence of arthropathy or other focal bone abnormality. Soft tissues are unremarkable. IMPRESSION: Negative. Electronically Signed   By: Charlett NoseKevin  Dover M.D.   On: 03/05/2017 11:43      Assessment and Plan: 31 y.o. male with right knee injury suspicious for  ACL versus meniscus injury. Plan for relative rest and MRI.  MRI is for surgical planning.  Work note provided.  He may return to work and is able to functionally climb a ladder without significant pain.  Naproxen prescribed.  Headache disorder: Refilled Imitrex and Fioricet.  If patient has to use these medications frequently I think is reasonable to proceed with medication for migraine prophylaxis    Orders Placed This Encounter  Procedures  . DG Knee Complete 4 Views Right    Please include patellar sunrise, lateral, and weightbearing bilateral AP and bilateral rosenberg views    Standing Status:   Future    Number of Occurrences:   1    Standing Expiration Date:   05/05/2018    Order Specific Question:   Reason for exam:    Answer:   Please include patellar sunrise, lateral, and weightbearing bilateral AP and bilateral rosenberg views    Comments:   Please include patellar sunrise, lateral, and weightbearing bilateral AP and bilateral rosenberg views    Order Specific Question:   Preferred imaging location?    Answer:   [facility]  . MR Knee Right Wo Contrast    Standing Status:   Future    Standing Expiration Date:   05/05/2018    Order Specific Question:   What is the patient's sedation requirement?    Answer:   No Sedation    Order Specific Question:   Does the patient have a pacemaker or implanted devices?    Answer:   No    Order Specific Question:   Preferred imaging location?    Answer:   [facility] (table limit-350lbs)    Order Specific Question:   Radiology Contrast Protocol - do NOT remove file path    Answer:   [email or web address]   Meds ordered this encounter  Medications  . naproxen (NAPROSYN) 500 MG tablet    Sig: Take 1 tablet (500 mg total) by mouth 2 (two) times daily with a meal.    Dispense:  60 tablet    Refill:  3  . SUMAtriptan (IMITREX) 50 MG tablet    Sig: Take 1 tablet (50 mg total) by mouth every 2  (two) hours as needed for migraine. May repeat in 2 hours if headache persists or recurs.    Dispense:  30 tablet    Refill:  0  . butalbital-acetaminophen-caffeine (FIORICET, ESGIC) 50-325-40 MG tablet    Sig: Take 1-2 tablets by mouth every 6 (six) hours as needed for headache.    Dispense:  20 tablet    Refill:  0    Discussed warning signs or symptoms. Please see discharge instructions. Patient expresses understanding.

## 2017-03-05 NOTE — Patient Instructions (Signed)
Thank you for coming in today. Get xray today.  Continue ice, compression and rest.  Use naproxen for pain.   Schedule MRI. We will cancel it if you get better.   For headaches use the Rescue medicine sparingly.   We will consider prevention medicine like Topamax.    Meniscus Tear A meniscus tear is a knee injury in which a piece of the meniscus is torn. The meniscus is a thick, rubbery, wedge-shaped cartilage in the knee. Two menisci are located in each knee. They sit between the upper bone (femur) and lower bone (tibia) that make up the knee joint. Each meniscus acts as a shock absorber for the knee. A torn meniscus is one of the most common types of knee injuries. This injury can range from mild to severe. Surgery may be needed for a severe tear. What are the causes? This injury may be caused by any squatting, twisting, or pivoting movement. Sports-related injuries are the most common cause. These often occur from:  Running and stopping suddenly.  Changing direction.  Being tackled or knocked off your feet.  As people get older, their meniscus gets thinner and weaker. In these people, tears can happen more easily, such as from climbing stairs. What increases the risk? This injury is more likely to happen to:  People who play contact sports.  Males.  People who are 2230?29 years of age.  What are the signs or symptoms? Symptoms of this injury include:  Knee pain, especially at the side of the knee joint. You may feel pain when the injury occurs, or you may only hear a pop and feel pain later.  A feeling that your knee is clicking, catching, locking, or giving way.  Not being able to fully bend or extend your knee.  Bruising or swelling in your knee.  How is this diagnosed? This injury may be diagnosed based on your symptoms and a physical exam. The physical exam may include:  Moving your knee in different ways.  Feeling for tenderness.  Listening for a clicking  sound.  Checking if your knee locks or catches.  You may also have tests, such as:  X-rays.  MRI.  A procedure to look inside your knee with a narrow surgical telescope (arthroscopy).  You may be referred to a knee specialist (orthopedic surgeon). How is this treated? Treatment for this injury depends on the severity of the tear. Treatment for a mild tear may include:  Rest.  Medicine to reduce pain and swelling. This is usually a nonsteroidal anti-inflammatory drug (NSAID).  A knee brace or an elastic sleeve or wrap.  Using crutches or a walker to keep weight off your knee and to help you walk.  Exercises to strengthen your knee (physical therapy).  You may need surgery if you have a severe tear or if other treatments are not working. Follow these instructions at home: Managing pain and swelling  Take over-the-counter and prescription medicines only as told by your health care provider.  If directed, apply ice to the injured area: ? Put ice in a plastic bag. ? Place a towel between your skin and the bag. ? Leave the ice on for 20 minutes, 2-3 times per day.  Raise (elevate) the injured area above the level of your heart while you are sitting or lying down. Activity  Do not use the injured limb to support your body weight until your health care provider says that you can. Use crutches or a walker as told  by your health care provider.  Return to your normal activities as told by your health care provider. Ask your health care provider what activities are safe for you.  Perform range-of-motion exercises only as told by your health care provider.  Begin doing exercises to strengthen your knee and leg muscles only as told by your health care provider. After you recover, your health care provider may recommend these exercises to help prevent another injury. General instructions  Use a knee brace or elastic wrap as told by your health care provider.  Keep all follow-up  visits as told by your health care provider. This is important. Contact a health care provider if:  You have a fever.  Your knee becomes red, tender, or swollen.  Your pain medicine is not helping.  Your symptoms get worse or do not improve after 2 weeks of home care. This information is not intended to replace advice given to you by your health care provider. Make sure you discuss any questions you have with your health care provider. Document Released: 06/22/2002 Document Revised: 09/07/2015 Document Reviewed: 07/25/2014 Elsevier Interactive Patient Education  [employer]2018 Elsevier Inc.

## 2017-03-10 ENCOUNTER — Telehealth: Payer: Self-pay | Admitting: Family Medicine

## 2017-03-10 NOTE — Telephone Encounter (Signed)
Xrays looked normal. MRI is the next step to look for ligament or meniscus injury. Please schedule.

## 2017-03-10 NOTE — Telephone Encounter (Signed)
Patient would like a phone call with his xray results from last week. Thanks

## 2017-03-10 NOTE — Telephone Encounter (Signed)
Routing to Provider for results

## 2017-03-11 NOTE — Telephone Encounter (Signed)
Called patient to give results but a recording came on that said patient is not taking calls, will try back later. Dayan Desa,CMA

## 2017-03-12 NOTE — Telephone Encounter (Signed)
Called patient to give results but no vm was set up. Letter sent to patient. Nichael Ehly,CMA

## 2017-03-12 NOTE — Telephone Encounter (Signed)
Patient advised of results.

## 2017-03-17 ENCOUNTER — Ambulatory Visit (INDEPENDENT_AMBULATORY_CARE_PROVIDER_SITE_OTHER): Payer: BLUE CROSS/BLUE SHIELD

## 2017-03-17 DIAGNOSIS — S8991XA Unspecified injury of right lower leg, initial encounter: Secondary | ICD-10-CM | POA: Diagnosis not present

## 2017-03-17 DIAGNOSIS — M25561 Pain in right knee: Secondary | ICD-10-CM | POA: Diagnosis not present

## 2017-03-17 DIAGNOSIS — M7989 Other specified soft tissue disorders: Secondary | ICD-10-CM | POA: Diagnosis not present

## 2017-06-03 DIAGNOSIS — N132 Hydronephrosis with renal and ureteral calculous obstruction: Secondary | ICD-10-CM | POA: Diagnosis not present

## 2017-06-03 DIAGNOSIS — N202 Calculus of kidney with calculus of ureter: Secondary | ICD-10-CM | POA: Diagnosis not present

## 2017-06-03 DIAGNOSIS — N134 Hydroureter: Secondary | ICD-10-CM | POA: Diagnosis not present

## 2017-06-03 DIAGNOSIS — F17200 Nicotine dependence, unspecified, uncomplicated: Secondary | ICD-10-CM | POA: Diagnosis not present

## 2017-06-09 DIAGNOSIS — Y9289 Other specified places as the place of occurrence of the external cause: Secondary | ICD-10-CM | POA: Diagnosis not present

## 2017-06-09 DIAGNOSIS — Y9389 Activity, other specified: Secondary | ICD-10-CM | POA: Diagnosis not present

## 2017-06-09 DIAGNOSIS — F17210 Nicotine dependence, cigarettes, uncomplicated: Secondary | ICD-10-CM | POA: Diagnosis not present

## 2017-06-09 DIAGNOSIS — S61411A Laceration without foreign body of right hand, initial encounter: Secondary | ICD-10-CM | POA: Diagnosis not present

## 2017-06-09 DIAGNOSIS — W260XXA Contact with knife, initial encounter: Secondary | ICD-10-CM | POA: Diagnosis not present

## 2017-06-12 ENCOUNTER — Encounter: Payer: Self-pay | Admitting: Family Medicine

## 2017-06-18 ENCOUNTER — Ambulatory Visit (INDEPENDENT_AMBULATORY_CARE_PROVIDER_SITE_OTHER): Payer: BLUE CROSS/BLUE SHIELD | Admitting: Family Medicine

## 2017-06-18 ENCOUNTER — Encounter: Payer: Self-pay | Admitting: Family Medicine

## 2017-06-18 VITALS — BP 122/77 | HR 88 | Ht 69.0 in | Wt 188.0 lb

## 2017-06-18 DIAGNOSIS — R51 Headache: Secondary | ICD-10-CM | POA: Diagnosis not present

## 2017-06-18 DIAGNOSIS — N200 Calculus of kidney: Secondary | ICD-10-CM | POA: Insufficient documentation

## 2017-06-18 DIAGNOSIS — G8929 Other chronic pain: Secondary | ICD-10-CM

## 2017-06-18 DIAGNOSIS — S61411A Laceration without foreign body of right hand, initial encounter: Secondary | ICD-10-CM

## 2017-06-18 MED ORDER — TAMSULOSIN HCL 0.4 MG PO CAPS: 0.4000 mg | ORAL_CAPSULE | Freq: Every day | ORAL | 3 refills | Status: DC

## 2017-06-18 MED ORDER — TOPIRAMATE 50 MG PO TABS: 50.0000 mg | ORAL_TABLET | Freq: Every day | ORAL | 1 refills | Status: DC

## 2017-06-18 NOTE — Progress Notes (Signed)
Howard Fritz is a 32 y.o. male who [facility] today for suture removal, kidney stones, migraine headaches.  Howard Fritz suffered a laceration to the right thumb thenar eminence on February 25.  This was seen in the emergency department where the wound was irrigated and repaired with simple interrupted sutures.  He notes in the interval Howard Fritz suffered a wound dehiscence.  He notes the wound became erythematous with discharge.  He cleaned the wound with wound cleaner as his wife is a Engineer, civil (consulting)nurse.  He then approximated the wound edges and closed with tape.  Since then he feels great with no further redness or pain or discharge.  He notes mild tenderness at the wound site.  He denies any fevers or chills.  He feels well otherwise.  He notes his last tetanus vaccine was in 2012.  Additionally Howard Fritz notes that since his last visit a few weeks ago he developed severe onset of left flank pain radiating to the groin.  He was seen in outside emergency room (I do not have records available) where he was diagnosed with a left-sided 3 mm kidney stone.  He notes the pain is improved but he does not think that he has passed the stone.  He denies any prior history of kidney stones.  No fevers or chills vomiting or diarrhea.  Lastly Howard Fritz has a history of chronic migraine headaches.  He currently notes that he seems to be doing pretty well with Imitrex.  He notes this does help stop most of his migraines but he notes that he is having still about 3 pretty severe migraines per month and do interfere with his ability to work at times.  He notes that both his mother and sister take Topamax to good effect to help manage chronic migraines.   Past Medical History:  Diagnosis Date  . Allergic rhinitis 02/11/2013   No past surgical history on file. Social History   Tobacco Use  .  Smoking status: Current Every Day Smoker  . Smokeless tobacco: Never Used  Substance Use Topics  . Alcohol use: No   family history is not on file.  ROS as above:  Medications: Current Outpatient Medications  Medication Sig Dispense Refill  . butalbital-acetaminophen-caffeine (FIORICET, ESGIC) 50-325-40 MG tablet Take 1-2 tablets by mouth every 6 (six) hours as needed for headache. 20 tablet 0  . naproxen (NAPROSYN) 500 MG tablet Take 1 tablet (500 mg total) by mouth 2 (two) times daily with a meal. 60 tablet 3  . SUMAtriptan (IMITREX) 50 MG tablet Take 1 tablet (50 mg total) by mouth every 2 (two) hours as needed for migraine. May repeat in 2 hours if headache persists or recurs. 30 tablet 0  . tamsulosin (FLOMAX) 0.4 MG CAPS capsule Take 1 capsule (0.4 mg total) by mouth daily. 30 capsule 3  . topiramate (TOPAMAX) 50 MG tablet Take 1 tablet (50 mg total) by mouth daily. 90 tablet 1   No current facility-administered medications for this visit.    Allergies  Allergen Reactions  . Hydrocodone-Acetaminophen     nausea    Health Maintenance Health Maintenance  Topic Date Due  . HIV Screening  07/24/2002  . INFLUENZA VACCINE  02/18/2018 (Originally 11/13/2016)  . TETANUS/TDAP  09/13/2021     Exam:  BP 122/77   Pulse 88   Ht 5\' 9"  (1.753 m)   Wt 188 lb (85.3 kg)   BMI 27.76 kg/m  Gen: Well NAD HEENT: EOMI,  MMM Lungs: Normal work of breathing. CTABL Heart: RRR no MRG Abd: NABS, Soft. Nondistended, Nontender No CV angle TTP Exts: Brisk capillary refill, warm and well perfused.  Right hand laceration well-appearing with no significant wound dehiscence severe erythema or exudate.  Mildly tender. 3 small simple interrupted sutures removed    No results found for this or any previous visit (from the past 72 hour(s)). No results found.    Assessment and Plan: 32 y.o. male with  Hand laceration: Doing well.  Plan for watchful waiting with wound care with ointment.   Recheck if not better.  Tetanus up-to-date.  Chronic migraines: Ongoing and symptomatic despite improvement with Imitrex.  Reasonable at this point to start prophylaxis with Topamax.  Recheck in a month or so  Kidney stone: Unclear if passed.  At this point reasonable to refer to urology.  Start empiric treatment with Flomax..   Orders Placed This Encounter  Procedures  . Ambulatory referral to Urology    Referral Priority:   Routine    Referral Type:   Consultation    Referral Reason:   Specialty Services Required    Requested Specialty:   Urology    Number of Visits Requested:   1   Meds ordered this encounter  Medications  . topiramate (TOPAMAX) 50 MG tablet    Sig: Take 1 tablet (50 mg total) by mouth daily.    Dispense:  90 tablet    Refill:  1  . tamsulosin (FLOMAX) 0.4 MG CAPS capsule    Sig: Take 1 capsule (0.4 mg total) by mouth daily.    Dispense:  30 capsule    Refill:  3     Discussed warning signs or symptoms. Please see discharge instructions. Patient expresses understanding.

## 2017-06-18 NOTE — Patient Instructions (Addendum)
Thank you for coming in today. You should hear about Urology for kidney stone.  Take flomax daily.  Start topomax for migraine.  Recheck with me in 3 months.  Keep the wound covered with ointment.    Kidney Stones Kidney stones (urolithiasis) are rock-like masses that form inside of the kidneys. Kidneys are organs that make pee (urine). A kidney stone can cause very bad pain and can block the flow of pee. The stone usually leaves your body (passes) through your pee. You may need to have a doctor take out the stone. Follow these instructions at home: Eating and drinking  Drink enough fluid to keep your pee clear or pale yellow. This will help you pass the stone.  If told by your doctor, change the foods you eat (your diet). This may include: ? Limiting how much salt (sodium) you eat. ? Eating more fruits and vegetables. ? Limiting how much meat, poultry, fish, and eggs you eat.  Follow instructions from your doctor about eating or drinking restrictions. General instructions  Collect pee samples as told by your doctor. You may need to collect a pee sample: ? 24 hours after a stone comes out. ? 8-12 weeks after a stone comes out, and every 6-12 months after that.  Strain your pee every time you pee (urinate), for as long as told. Use the strainer that your doctor recommends.  Do not throw out the stone. Keep it so that it can be tested by your doctor.  Take over-the-counter and prescription medicines only as told by your doctor.  Keep all follow-up visits as told by your doctor. This is important. You may need follow-up tests. Preventing kidney stones To prevent another kidney stone:  Drink enough fluid to keep your pee clear or pale yellow. This is the best way to prevent kidney stones.  Eat healthy foods.  Avoid certain foods as told by your doctor. You may be told to eat less protein.  Stay at a healthy weight.  Contact a doctor if:  You have pain that gets worse or  does not get better with medicine. Get help right away if:  You have a fever or chills.  You get very bad pain.  You get new pain in your belly (abdomen).  You pass out (faint).  You cannot pee. This information is not intended to replace advice given to you by your health care provider. Make sure you discuss any questions you have with your health care provider. Document Released: 09/18/2007 Document Revised: 12/19/2015 Document Reviewed: 12/19/2015 Elsevier Interactive Patient Education  2017 Elsevier Inc.  Topiramate tablets What is this medicine? TOPIRAMATE (toe PYRE a mate) is used to treat seizures in adults or children with epilepsy. It is also used for the prevention of migraine headaches. This medicine may be used for other purposes; ask your health care provider or pharmacist if you have questions. COMMON BRAND NAME(S): Topamax, Topiragen What should I tell my health care provider before I take this medicine? They need to know if you have any of these conditions: -bleeding disorders -cirrhosis of the liver or liver disease -diarrhea -glaucoma -kidney stones or kidney disease -low blood counts, like low white cell, platelet, or red cell counts -lung disease like asthma, obstructive pulmonary disease, emphysema -metabolic acidosis -on a ketogenic diet -schedule for surgery or a procedure -suicidal thoughts, plans, or attempt; a previous suicide attempt by you or a family member -an unusual or allergic reaction to topiramate, other medicines, foods, dyes, or  preservatives -pregnant or trying to get pregnant -breast-feeding How should I use this medicine? Take this medicine by mouth with a glass of water. Follow the directions on the prescription label. Do not crush or chew. You may take this medicine with meals. Take your medicine at regular intervals. Do not take it more often than directed. Talk to your pediatrician regarding the use of this medicine in children.  Special care may be needed. While this drug may be prescribed for children as young as 92 years of age for selected conditions, precautions do apply. Overdosage: If you think you have taken too much of this medicine contact a poison control center or emergency room at once. NOTE: This medicine is only for you. Do not share this medicine with others. What if I miss a dose? If you miss a dose, take it as soon as you can. If your next dose is to be taken in less than 6 hours, then do not take the missed dose. Take the next dose at your regular time. Do not take double or extra doses. What may interact with this medicine? Do not take this medicine with any of the following medications: -probenecid This medicine may also interact with the following medications: -acetazolamide -alcohol -amitriptyline -aspirin and aspirin-like medicines -birth control pills -certain medicines for depression -certain medicines for seizures -certain medicines that treat or prevent blood clots like warfarin, enoxaparin, dalteparin, apixaban, dabigatran, and rivaroxaban -digoxin -hydrochlorothiazide -lithium -medicines for pain, sleep, or muscle relaxation -metformin -methazolamide -NSAIDS, medicines for pain and inflammation, like ibuprofen or naproxen -pioglitazone -risperidone This list may not describe all possible interactions. Give your health care provider a list of all the medicines, herbs, non-prescription drugs, or dietary supplements you use. Also tell them if you smoke, drink alcohol, or use illegal drugs. Some items may interact with your medicine. What should I watch for while using this medicine? Visit your doctor or health care professional for regular checks on your progress. Do not stop taking this medicine suddenly. This increases the risk of seizures if you are using this medicine to control epilepsy. Wear a medical identification bracelet or chain to say you have epilepsy or seizures, and carry a  card that lists all your medicines. This medicine can decrease sweating and increase your body temperature. Watch for signs of deceased sweating or fever, especially in children. Avoid extreme heat, hot baths, and saunas. Be careful about exercising, especially in hot weather. Contact your health care provider right away if you notice a fever or decrease in sweating. You should drink plenty of fluids while taking this medicine. If you have had kidney stones in the past, this will help to reduce your chances of forming kidney stones. If you have stomach pain, with nausea or vomiting and yellowing of your eyes or skin, call your doctor immediately. You may get drowsy, dizzy, or have blurred vision. Do not drive, use machinery, or do anything that needs mental alertness until you know how this medicine affects you. To reduce dizziness, do not sit or stand up quickly, especially if you are an older patient. Alcohol can increase drowsiness and dizziness. Avoid alcoholic drinks. If you notice blurred vision, eye pain, or other eye problems, seek medical attention at once for an eye exam. The use of this medicine may increase the chance of suicidal thoughts or actions. Pay special attention to how you are responding while on this medicine. Any worsening of mood, or thoughts of suicide or dying should be reported  to your health care professional right away. This medicine may increase the chance of developing metabolic acidosis. If left untreated, this can cause kidney stones, bone disease, or slowed growth in children. Symptoms include breathing fast, fatigue, loss of appetite, irregular heartbeat, or loss of consciousness. Call your doctor immediately if you experience any of these side effects. Also, tell your doctor about any surgery you plan on having while taking this medicine since this may increase your risk for metabolic acidosis. Birth control pills may not work properly while you are taking this medicine.  Talk to your doctor about using an extra method of birth control. Women who become pregnant while using this medicine may enroll in the [place] Antiepileptic Drug Pregnancy Registry by calling [id number]. This registry collects information about the safety of antiepileptic drug use during pregnancy. What side effects may I notice from receiving this medicine? Side effects that you should report to your doctor or health care professional as soon as possible: -allergic reactions like skin rash, itching or hives, swelling of the face, lips, or tongue -decreased sweating and/or rise in body temperature -depression -difficulty breathing, fast or irregular breathing patterns -difficulty speaking -difficulty walking or controlling muscle movements -hearing impairment -redness, blistering, peeling or loosening of the skin, including inside the mouth -tingling, pain or numbness in the hands or feet -unusual bleeding or bruising -unusually weak or tired -worsening of mood, thoughts or actions of suicide or dying Side effects that usually do not require medical attention (report to your doctor or health care professional if they continue or are bothersome): -altered taste -back pain, joint or muscle aches and pains -diarrhea, or constipation -headache -loss of appetite -nausea -stomach upset, indigestion -tremors This list may not describe all possible side effects. Call your doctor for medical advice about side effects. You may report side effects to FDA at 1-800-FDA-1088. Where should I keep my medicine? Keep out of the reach of children. Store at room temperature between 15 and 30 degrees C (59 and 86 degrees F) in a tightly closed container. Protect from moisture. Throw away any unused medicine after the expiration date. NOTE: This sheet is a summary. It may not cover all possible information. If you have questions about this medicine, talk to your doctor, pharmacist, or health care  provider.  2018 Elsevier/Gold Standard (2013-04-05 23:17:57)

## 2017-09-12 IMAGING — DX DG CHEST 2V
2 series · 2 of 2 positions shown · non-contrast
Comparison: 03/02/2014.

CLINICAL DATA: Cough for the past 2 weeks.

EXAM:
CHEST  2 VIEW

[chest pa]
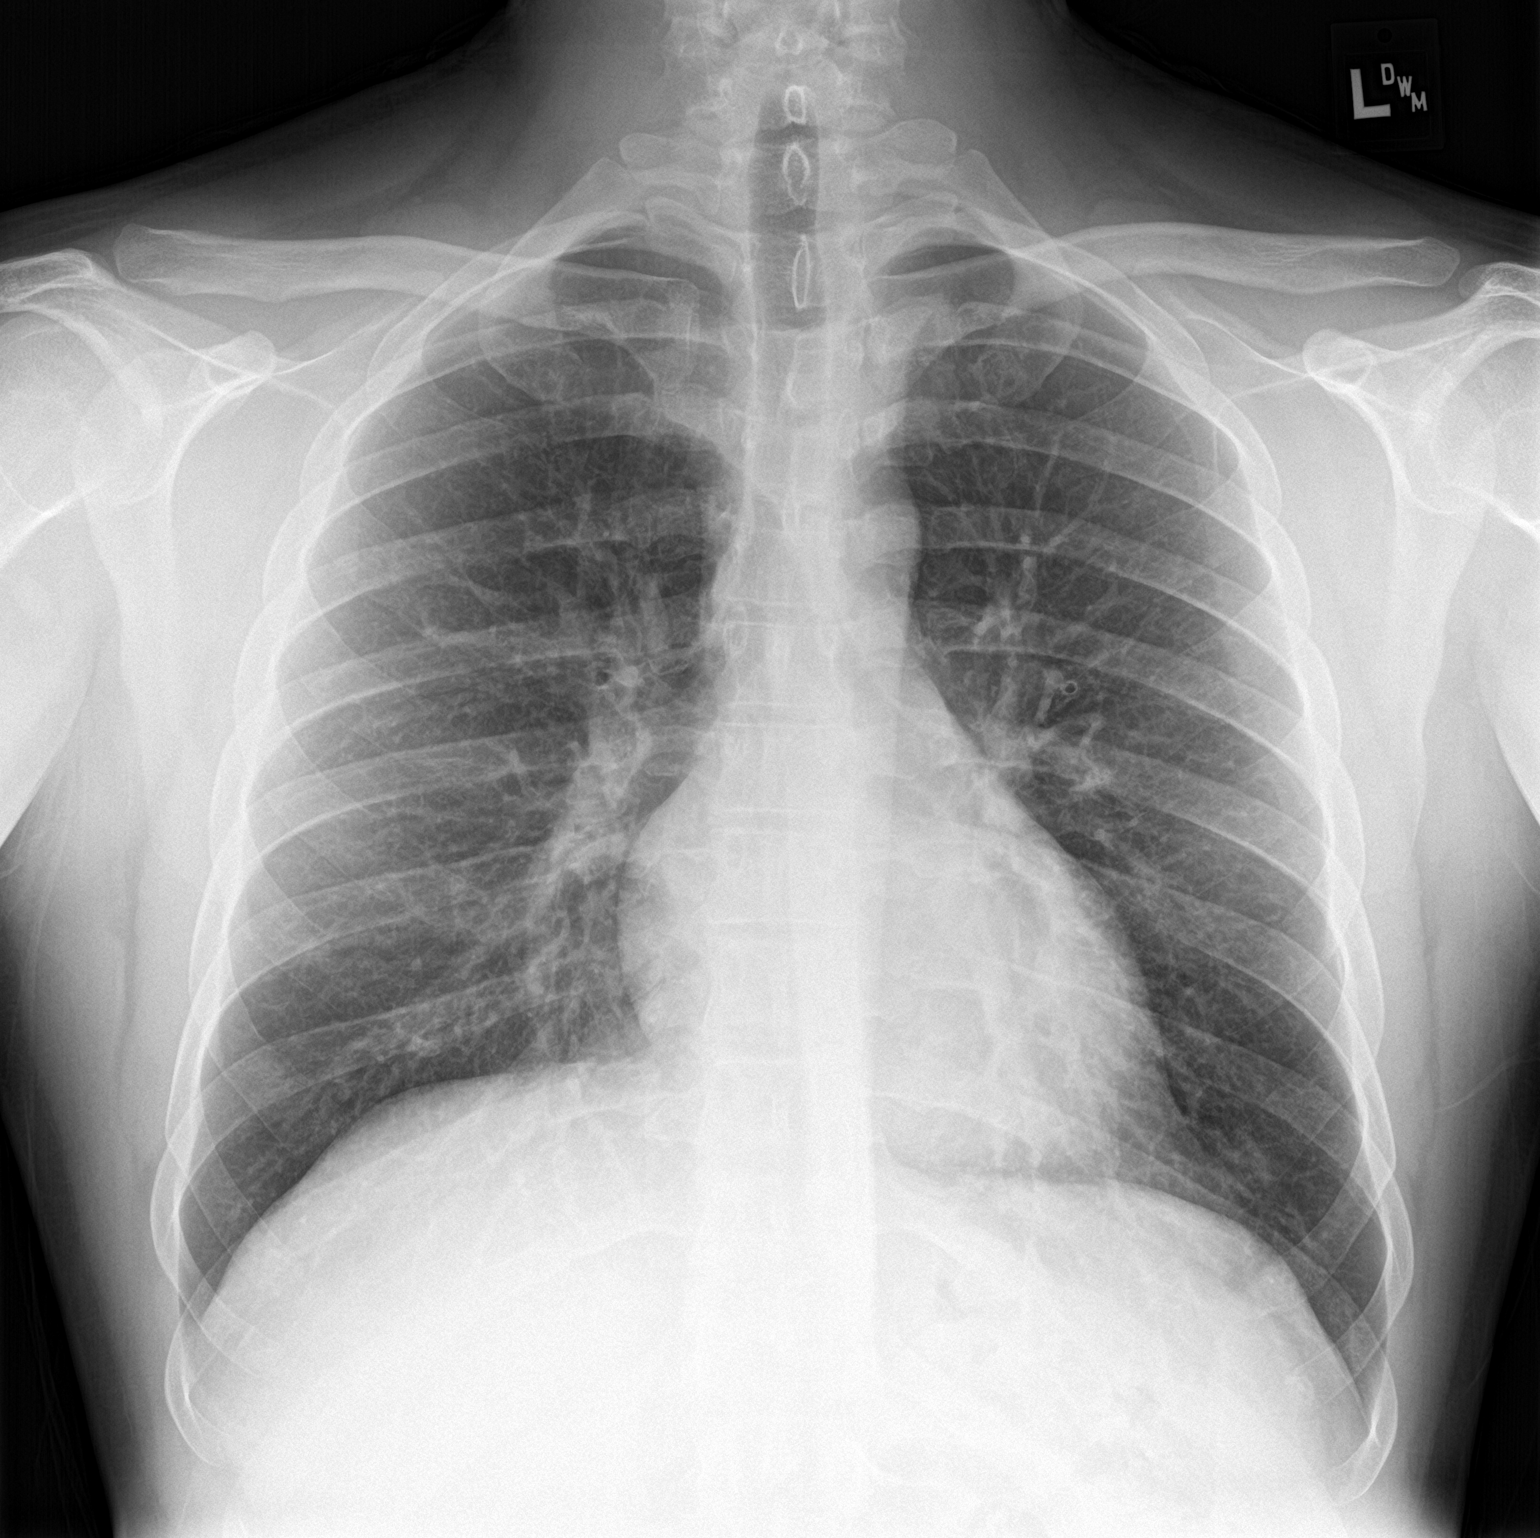

[chest lat]
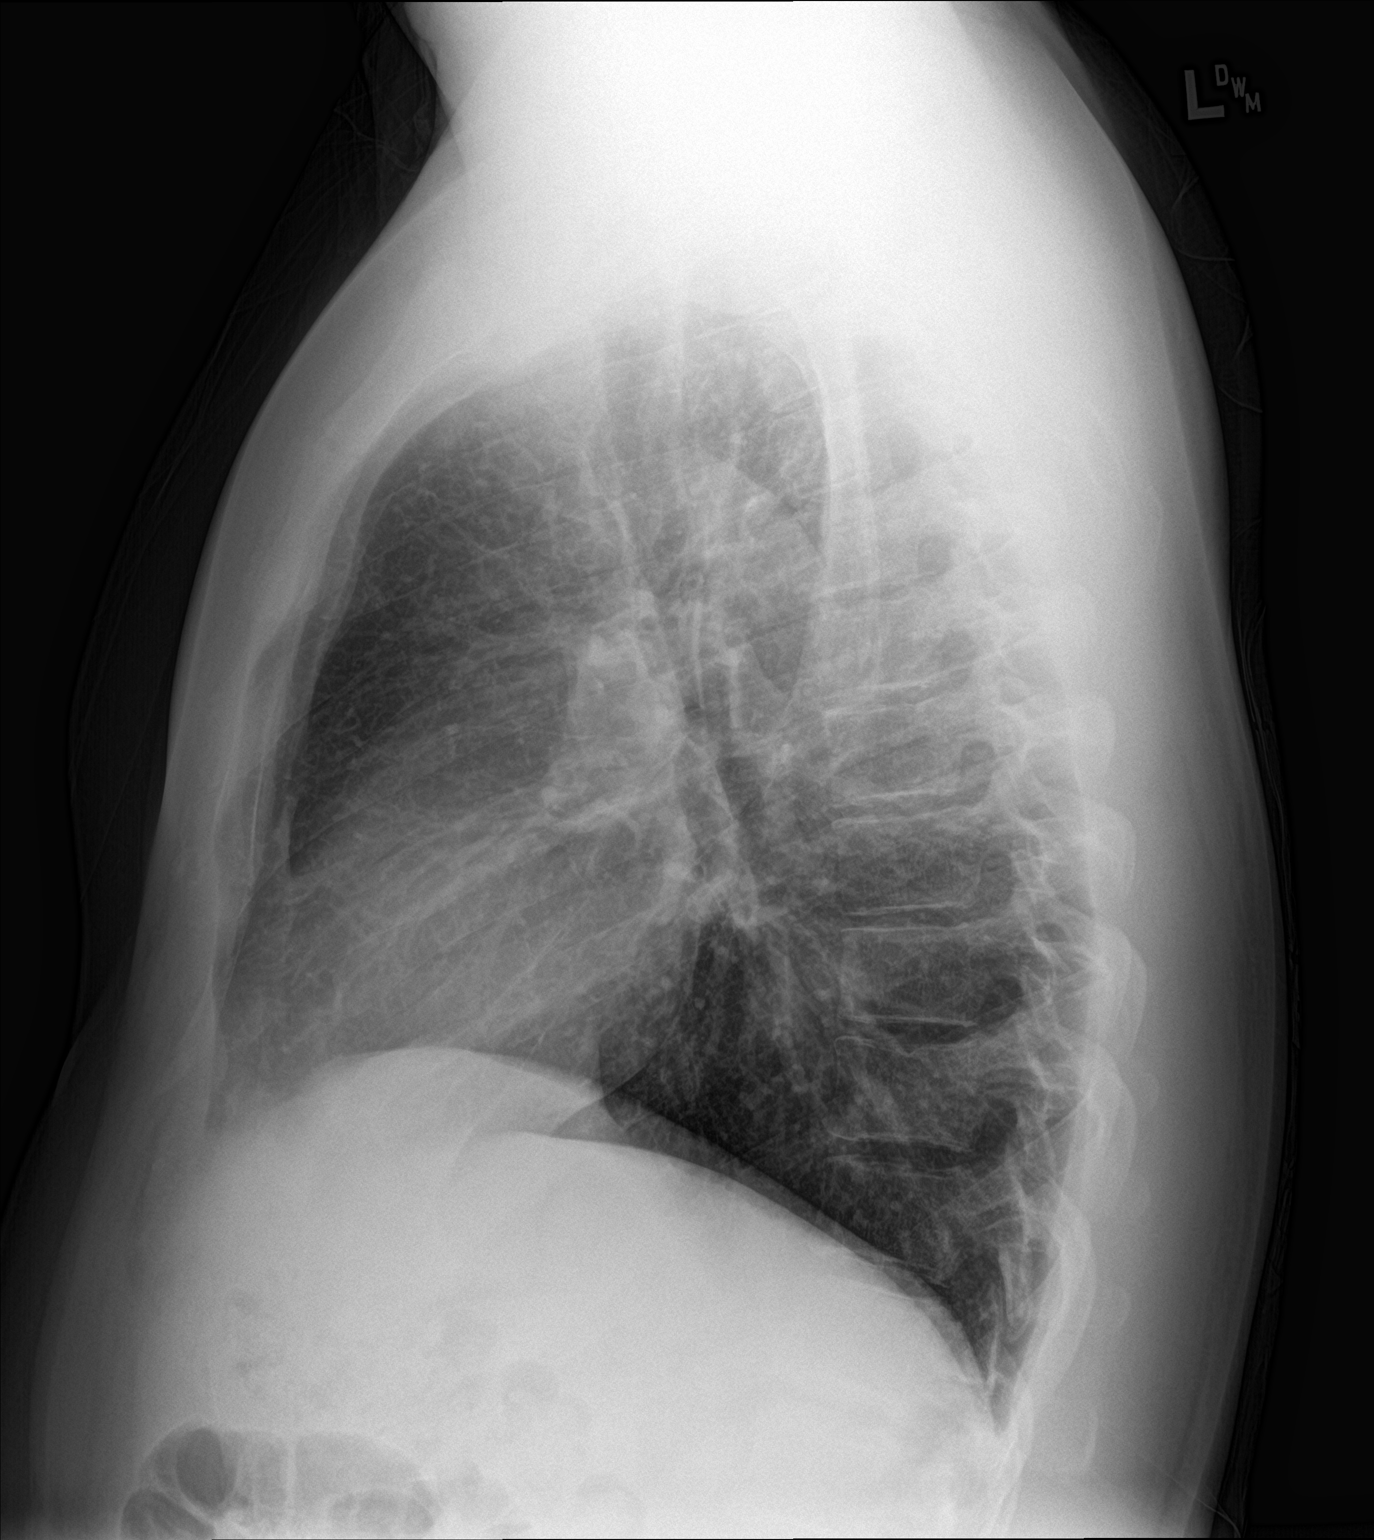

[2 of 2 positions shown; findings below may reference images not displayed]

FINDINGS: Normal sized heart. Clear lungs. Mild central peribronchial
thickening. Normal appearing bones.
IMPRESSION: Mild bronchitic changes.

## 2017-09-26 ENCOUNTER — Other Ambulatory Visit: Payer: Self-pay | Admitting: Family Medicine

## 2017-12-15 ENCOUNTER — Emergency Department (INDEPENDENT_AMBULATORY_CARE_PROVIDER_SITE_OTHER)
Admission: EM | Admit: 2017-12-15 | Discharge: 2017-12-15 | Disposition: A | Discharge status: Home / Self Care | Payer: BLUE CROSS/BLUE SHIELD | Source: Home / Self Care | Attending: Family Medicine | Admitting: Family Medicine

## 2017-12-15 ENCOUNTER — Emergency Department (INDEPENDENT_AMBULATORY_CARE_PROVIDER_SITE_OTHER): Payer: BLUE CROSS/BLUE SHIELD

## 2017-12-15 ENCOUNTER — Encounter: Payer: Self-pay | Admitting: Emergency Medicine

## 2017-12-15 DIAGNOSIS — X58XXXA Exposure to other specified factors, initial encounter: Secondary | ICD-10-CM | POA: Diagnosis not present

## 2017-12-15 DIAGNOSIS — S99921A Unspecified injury of right foot, initial encounter: Secondary | ICD-10-CM

## 2017-12-15 DIAGNOSIS — S92411A Displaced fracture of proximal phalanx of right great toe, initial encounter for closed fracture: Secondary | ICD-10-CM | POA: Diagnosis not present

## 2017-12-15 NOTE — ED Triage Notes (Signed)
Pt c/o right foot pain after falling while walking up the stairs yesterday. Taking ibuprofen.

## 2017-12-15 NOTE — Discharge Instructions (Signed)
°  You should wear the boot until you follow up with Dr. Denyse Amass for further evaluation and treatment this week. You may remove the boot to bath and apply a cool compress but it should otherwise stay on. You may use crutches as needed. Please call Dr. Zollie Pee office tomorrow to schedule a follow up appointment this week.

## 2017-12-15 NOTE — ED Provider Notes (Signed)
[name]    CSN: [id number] Arrival date & time: 12/15/17  1735     History   Chief Complaint Chief Complaint  Patient presents with  . Foot Pain    HPI Nazaire Cordial is a 32 y.o. male.   HPI Kenyan Karnes is a 32 y.o. male presenting to UC with c/o Right foot pain, bruising and swelling that started last night after falling while walking up the stairs. Symptoms are worse today. Unable to move his 1st-3rd toes on Right foot due to pain. Pain is aching and throbbing. No prior injury or surgery to same foot.     Past Medical History:  Diagnosis Date  . Allergic rhinitis 02/11/2013    Patient Active Problem List   Diagnosis Date Noted  . Kidney stone 06/18/2017  . Insomnia 05/11/2015  . Allergic rhinitis 02/11/2013  . Chronic headache 01/22/2012  . Current tobacco use 01/22/2012    History reviewed. No pertinent surgical history.     Home Medications    Prior to Admission medications   Medication Sig Start Date End Date Taking? Authorizing Provider  butalbital-acetaminophen-caffeine (FIORICET, ESGIC) 50-325-40 MG tablet Take 1-2 tablets by mouth every 6 (six) hours as needed for headache. 03/05/17 03/05/18  Rodolph Bong, MD  naproxen (NAPROSYN) 500 MG tablet Take 1 tablet (500 mg total) by mouth 2 (two) times daily with a meal. 03/05/17 03/05/18  Rodolph Bong, MD  SUMAtriptan (IMITREX) 50 MG tablet Take 1 tablet (50 mg total) by mouth every 2 (two) hours as needed for migraine. May repeat in 2 hours if headache persists or recurs. 03/05/17   Rodolph Bong, MD  tamsulosin (FLOMAX) 0.4 MG CAPS capsule TAKE 1 CAPSULE BY MOUTH EVERY DAY 09/29/17   Rodolph Bong, MD  topiramate (TOPAMAX) 50 MG tablet Take 1 tablet (50 mg total) by mouth daily. 06/18/17   Rodolph Bong, MD    Family History History reviewed. No pertinent family history.  Social History Social History   Tobacco Use  . Smoking status: Current Every Day Smoker    Types: Cigarettes    . Smokeless tobacco: Never Used  Substance Use Topics  . Alcohol use: No  . Drug use: No     Allergies   Hydrocodone-acetaminophen   Review of Systems Review of Systems  Musculoskeletal: Positive for arthralgias, joint swelling and myalgias.  Skin: Positive for color change. Negative for wound.     Physical Exam Triage Vital Signs ED Triage Vitals  Enc Vitals Group     BP 12/15/17 1801 118/76     Pulse Rate 12/15/17 1801 80     Resp --      Temp 12/15/17 1801 97.8 F (36.6 C)     Temp Source 12/15/17 1801 Oral     SpO2 12/15/17 1801 96 %     Weight 12/15/17 1802 185 lb (83.9 kg)     Height --      Head Circumference --      Peak Flow --      Pain Score 12/15/17 1802 9     Pain Loc --      Pain Edu? --      Excl. in GC? --    No data found.  Updated Vital Signs BP 118/76 (BP Location: Right Arm)   Pulse 80   Temp 97.8 F (36.6 C) (Oral)   Wt 185 lb (83.9 kg)   SpO2 96%   BMI 27.32 kg/m   Visual  Acuity Right Eye Distance:   Left Eye Distance:   Bilateral Distance:    Right Eye Near:   Left Eye Near:    Bilateral Near:     Physical Exam  Constitutional: He is oriented to person, place, and time. He appears well-developed and well-nourished.  HENT:  Head: Normocephalic and atraumatic.  Eyes: EOM are normal.  Neck: Normal range of motion.  Cardiovascular: Normal rate.  Pulmonary/Chest: Effort normal.  Musculoskeletal: He exhibits edema and tenderness.       Feet:  Right foot: mild edema to dorsal aspect at base of 1st-3rd toes. Bruising. Tenderness. Limited ROM due to pain.   Neurological: He is alert and oriented to person, place, and time.  Skin: Skin is warm and dry. Capillary refill takes less than 2 seconds. Ecchymosis noted.  Psychiatric: He has a normal mood and affect. His behavior is normal.  Nursing note and vitals reviewed.    UC Treatments / Results  Labs (all labs ordered are listed, but only abnormal results are  displayed) Labs Reviewed - No data to display  EKG None  Radiology Dg Foot Complete Right  Addendum Date: 12/15/2017   ADDENDUM REPORT: 12/15/2017 18:37 ADDENDUM: Patient's images are re reviewed, additional clinical information obtained. Patient has bruising and point tenderness at the great toe. Possible tiny cortical avulsions lateral base of the first proximal phalanx. Electronically Signed   By: Jasmine Pang M.D.   On: 12/15/2017 18:37   Result Date: 12/15/2017 CLINICAL DATA:  Right foot injury EXAM: RIGHT FOOT COMPLETE - 3+ VIEW COMPARISON:  None. FINDINGS: There is no evidence of fracture or dislocation. There is no evidence of arthropathy or other focal bone abnormality. Soft tissues are unremarkable. IMPRESSION: Negative. Electronically Signed: By: Jasmine Pang M.D. On: 12/15/2017 18:15    Procedures Procedures (including critical care time)  Medications Ordered in UC Medications - No data to display  Initial Impression / Assessment and Plan / UC Course  I have reviewed the triage vital signs and the nursing notes.  Pertinent labs & imaging results that were available during my care of the patient were reviewed by me and considered in my medical decision making (see chart for details).     Hx and exam c/w fracture of Right great toe.  Pt placed in a boot, instructed to f/u with Dr. Denyse Amass for continued management because pt drives a truck for a living.     Final Clinical Impressions(s) / UC Diagnoses   Final diagnoses:  Right foot injury, initial encounter  Closed fracture of proximal phalanx of right great toe, initial encounter     Discharge Instructions      You should wear the boot until you follow up with Dr. Denyse Amass for further evaluation and treatment this week. You may remove the boot to bath and apply a cool compress but it should otherwise stay on. You may use crutches as needed. Please call Dr. Zollie Pee office tomorrow to schedule a follow up appointment  this week.     ED Prescriptions    None     Controlled Substance Prescriptions [place] Controlled Substance Registry consulted? Not Applicable   Lurene Shadow, PA-C 12/16/17 1150

## 2017-12-17 ENCOUNTER — Ambulatory Visit (INDEPENDENT_AMBULATORY_CARE_PROVIDER_SITE_OTHER): Payer: BLUE CROSS/BLUE SHIELD | Admitting: Family Medicine

## 2017-12-17 ENCOUNTER — Encounter: Payer: Self-pay | Admitting: Family Medicine

## 2017-12-17 VITALS — BP 107/65 | HR 56 | Temp 98.0°F | Wt 190.4 lb

## 2017-12-17 DIAGNOSIS — S93529A Sprain of metatarsophalangeal joint of unspecified toe(s), initial encounter: Secondary | ICD-10-CM | POA: Diagnosis not present

## 2017-12-17 NOTE — Patient Instructions (Addendum)
Thank you for coming in today. Use the cam walker for longer distances walking and walking at home.  Use the turf toe insole with your shoe at work.    Recheck in about 3 weeks.  Return sooner if needed.   Naproxen for pain twice daily as needed.     Turf Toe Turf toe is an injury that affects the joint at the base of the big toe. It occurs when the toe is bent upward by force and extended beyond its normal limits (hyperextension). The joint of the big toe is surrounded by tissues (ligaments and tendons) that help to keep it in place. If any of these tissues are damaged, turf toe may result. Turf toe is a common sports injury. It can be mild if the tissue was stretched. It may be more severe if the tissue was partially or completely torn. Early treatment usually results in good recovery. In some cases, a person may continue to have some pain, joint stiffness, and reduced ability to push off from the affected foot. What are the causes? This injury is caused by extreme upward bending of the big toe joint. It can occur when:  Your toe is pressed flat to the ground and your heel is raised while you push off forcefully with the front of your foot. For example, this could happen when you begin a sprint.  You push off on the ball of your foot repeatedly while running or jumping, especially on hard surfaces such as a basketball court.  You jam your toe from a force pushing into the toe.  What increases the risk? This injury is more likely to occur in people:  Who wear flexible shoes that do not offer good support while running or jumping.  Who participate in activities or sports that involve running and jumping on turf or hard surfaces, such as: ? Soccer. ? Football. ? Basketball. ? Volleyball. ? Gymnastics. ? Dancing. ? Wrestling.  What are the signs or symptoms? Symptoms of this condition include:  Pain at the base of the toe.  Swelling at the base of the  toe.  Stiffness.  Limited movement of the toe.  Bruising.  If turf toe is the result of a direct injury, symptoms may appear suddenly. If the condition is due to repetitive movements, such as running and jumping, the symptoms may develop gradually and worsen over time. How is this diagnosed? This condition may be diagnosed based on your symptoms, your medical history, and a physical exam of your foot. During the exam, the health care provider will check the range of motion of your toe by moving it up and down and from side to side. You may also have tests to confirm the diagnosis, such as:  X-rays to rule out bone problems, such as a fracture, a chip, or bones that are out of alignment.  MRI to view the soft tissue and cartilage in your toe. This will help to determine how severe your injury is.  How is this treated? Treatment for this condition depends on the severity of the injury. Treatment may include:  Rest, ice, compression, and elevation. This is often called the RICE strategy. It may be recommended if the injury is mild. Your health care provider may also restrict movement of your toe by taping it to the smaller toes.  Wearing a walking boot or a cast. For more serious turf toe, you may need to wear a walking boot for about one week. This will keep  your toe from moving (immobilization). If you have severe turf toe, you may need to wear a cast or a walking boot for several weeks.  Over-the-counter medicines to relieve pain.  Physical therapy. Stretching and strengthening exercises can help to reduce or prevent joint stiffness in your toe.  Surgery. In rare cases, surgery may be needed for a severe injury if pain does not go away.  Follow these instructions at home: Managing pain, stiffness, and swelling  If directed, apply ice to the injured area: ? Put ice in a plastic bag. ? Place a towel between your skin and the bag. ? Leave the ice on for 20 minutes, 2-3 times per  day.  Move your toes often to avoid stiffness and to lessen swelling.  Wear an elastic compression bandage to help prevent or lessen swelling.  Raise (elevate) your foot above the level of your heart while you are sitting or lying down.  If you have a walking boot, wear it as told by your health care provider.  Do not use the injured foot to support (bear) your body weight until your health care provider says that you can. Use crutches as told by your health care provider. Medicines  Take over-the-counter and prescription medicines only as told by your health care provider.  Do not drive or operate heavy machinery while taking prescription pain medicine. If you have a cast:  Do not put pressure on any part of the cast until it is fully hardened. This may take several hours.  Do not stick anything inside the cast to scratch your skin. Doing that increases your risk of infection.  Check the skin around the cast every day. Report any concerns to your health care provider. You may put lotion on dry skin around the edges of the cast. Do not apply lotion to the skin underneath the cast.  Do not let your cast get wet if it is not waterproof.  Keep the cast clean. General instructions  If your cast is not waterproof, cover it with a watertight plastic bag when you take a bath or a shower.  Return to your normal activities as told by your health care provider. Ask your health care provider what activities are safe for you.  Switch to less-flexible, more supportive footwear as told by your health care provider. Rigid shoe inserts (orthotics) can also reduce stress on your toes and improve stability.  Keep all follow-up visits as told by your health care provider. This is important. Contact a health care provider if:  You have new bruising or swelling in your toe.  The pain in your toe gets worse.  Your pain medicine is not helping. Get help right away if:  Your cast or walking boot  becomes loose or damaged.  Your pain becomes severe.  Your toe becomes numb or changes color.  Your toe joint feels unstable or is unable to bear any weight. This information is not intended to replace advice given to you by your health care provider. Make sure you discuss any questions you have with your health care provider. Document Released: 09/21/2001 Document Revised: 12/03/2015 Document Reviewed: 10/12/2014 Elsevier Interactive Patient Education  2018 [employer].    Turf Toe Rehab Ask your health care provider which exercises are safe for you. Do exercises exactly as told by your health care provider and adjust them as directed. It is normal to feel mild stretching, pulling, tightness, or discomfort as you do these exercises, but you should stop  right away if you feel sudden pain or your pain gets worse. Do not begin these exercises until told by your health care provider. Stretching and range of motion exercises These exercises warm up your muscles and joints and improve the movement and flexibility of your foot. These exercises also help to relieve pain. Exercise A: Toe flexion  1. Sit with your left / rightleg crossed over your opposite knee. 2. Gently pull your big toe toward the bottom of your foot. You should feel a stretch on the top of your toe and foot. 3. Hold this position for __________ seconds. 4. Release your toe and return to the starting position. Repeat __________ times. Complete this exercise __________ times a day. Exercise B: Gastroc, standing  1. Stand with your hands against a wall. 2. Extend your left / right leg behind you, and bend your front knee slightly. Your heels should be on the floor. 3. Keeping your heels on the floor, keep your back knee straight and shift your weight toward the wall. You should feel a gentle stretch in the back of your lower leg (calf). 4. Hold this position for __________ seconds. 5. Return to the starting position. Repeat  __________ times. Complete this exercise __________ times a day. Exercise C: Soleus, standing 1. Stand with your hands against a wall. 2. Extend your left / right leg behind you, and bend your front knee slightly. Your heels should be on the floor. 3. Keeping your heels on the floor, bend your back knee and shift your weight slightly over your back leg. You should feel a gentle stretch deep in your calf. 4. Hold this position for __________ seconds. 5. Return to the starting position. Repeat __________ times. Complete this exercise __________ times a day. Strengthening exercises These exercises build strength and endurance in your foot. Endurance is the ability to use your muscles for a long time, even after they get tired. Exercise D: Towel curls ( toe flexors) 1. Sit in a chair on a non-carpeted surface, and put your feet on the floor. 2. Place a towel in front of your feet. If told by your health care provider, add __________ to the end of the towel. 3. Keeping your heel on the floor, put your left / right foot on the towel. 4. Pull the towel toward your heel by grabbing the towel with your left / right toes and curling them under. Keep your heel on the floor while you do this. 5. Let your toes relax. 6. Grab the towel again. Keep going until the towel is completely underneath your foot. Repeat __________ times. Complete this exercise __________ times a day. Exercise E: Arch lifts ( foot intrinsics) 1. Sit in a chair with your feet flat on the floor. 2. Keeping your big toe and your heel on the floor, lift only your arch, which is on the inner edge of your left / right foot. Do not move your knees or scrunch your toes. This is a small movement. 3. Hold this position for __________ seconds. 4. Slowly return to the starting position. Repeat __________ times. Complete this exercise __________ times a day. This information is not intended to replace advice given to you by your health care  provider. Make sure you discuss any questions you have with your health care provider. Document Released: 04/01/2005 Document Revised: 12/07/2015 Document Reviewed: 02/13/2015 Elsevier Interactive Patient Education  2018 [employer].

## 2017-12-17 NOTE — Progress Notes (Signed)
Howard Fritz is a 32 y.o. male who presents to [facility]: Primary Care Sports Medicine today for right toe injury.  Howard Fritz suffered a dorsiflexion injury to his right great toe on September 1.  He was walking upstairs and slipped in his first and second toes were forcefully dorsiflexed.  He fell and landed.  He was seen in urgent care the next day where x-rays showed a possible tiny avulsion fragment at the plantar aspect of his first MTP.  He was treated with a Cam walker boot which does help him move.  He works as a Naval architect doing deliveries and notes that he cannot wear the cam walker boot on his right foot at work.  He is having trouble working because of pain.  He is tried some over-the-counter medicines for pain control which do help.  He denies any radiating pain weakness or numbness fevers or chills.   ROS as above:  Exam:  BP 107/65   Pulse (!) 56   Temp 98 F (36.7 C) (Oral)   Wt 190 lb 6.4 oz (86.4 kg)   BMI 28.12 kg/m  Wt Readings from Last 5 Encounters:  12/17/17 190 lb 6.4 oz (86.4 kg)  12/15/17 185 lb (83.9 kg)  06/18/17 188 lb (85.3 kg)  03/05/17 188 lb 14.4 oz (85.7 kg)  09/25/16 188 lb (85.3 kg)    Gen: Well NAD HEENT: EOMI,  MMM Lungs: Normal work of breathing. CTABL Heart: RRR no MRG Abd: NABS, Soft. Nondistended, Nontender Exts: Brisk capillary refill, warm and well perfused.  Right foot swollen with slight ecchymosis across the first second and third MTP dorsally.  Tender to palpation first MTP.  Pain with motion of the MTP.  Capillary refill and sensation are intact.  Patient does have intact strength to dorsiflexion and plantarflexion of first toe.  Lab and Radiology Results No results found for this or any previous visit (from the past 72 hour(s)). Dg Foot Complete Right  Addendum Date: 12/15/2017   ADDENDUM REPORT: 12/15/2017 18:37 ADDENDUM: Patient's  images are re reviewed, additional clinical information obtained. Patient has bruising and point tenderness at the great toe. Possible tiny cortical avulsions lateral base of the first proximal phalanx. Electronically Signed   By: Jasmine Pang M.D.   On: 12/15/2017 18:37   Result Date: 12/15/2017 CLINICAL DATA:  Right foot injury EXAM: RIGHT FOOT COMPLETE - 3+ VIEW COMPARISON:  None. FINDINGS: There is no evidence of fracture or dislocation. There is no evidence of arthropathy or other focal bone abnormality. Soft tissues are unremarkable. IMPRESSION: Negative. Electronically Signed: By: Jasmine Pang M.D. On: 12/15/2017 18:15   I personally (independently) visualized and performed the interpretation of the images attached in this note.    Assessment and Plan: 32 y.o. male with turf toe injury first toe right foot.  Doubtful significant fracture.  Tiny avulsion fragment possible on x-ray.  Plan for turf toe steel insole at work if able.  Resume work when able.  Home exercise program ice rest and NSAIDs for pain control.  Recheck in about 3 weeks.  Return sooner if needed.  Prescription strength NSAID naproxen.  Historical information moved to improve visibility of documentation.  Past Medical History:  Diagnosis Date  . Allergic rhinitis 02/11/2013   No past surgical history on file. Social History   Tobacco Use  . Smoking status: Current Every Day Smoker    Types: Cigarettes  . Smokeless tobacco: Never Used  Substance Use Topics  . Alcohol use: No   family history is not on file.  Medications: Current Outpatient Medications  Medication Sig Dispense Refill  . butalbital-acetaminophen-caffeine (FIORICET, ESGIC) 50-325-40 MG tablet Take 1-2 tablets by mouth every 6 (six) hours as needed for headache. 20 tablet 0  . naproxen (NAPROSYN) 500 MG tablet Take 1 tablet (500 mg total) by mouth 2 (two) times daily with a meal. 60 tablet 3  . SUMAtriptan (IMITREX) 50 MG tablet Take 1 tablet  (50 mg total) by mouth every 2 (two) hours as needed for migraine. May repeat in 2 hours if headache persists or recurs. 30 tablet 0  . tamsulosin (FLOMAX) 0.4 MG CAPS capsule TAKE 1 CAPSULE BY MOUTH EVERY DAY 30 capsule 0  . topiramate (TOPAMAX) 50 MG tablet Take 1 tablet (50 mg total) by mouth daily. 90 tablet 1   No current facility-administered medications for this visit.    Allergies  Allergen Reactions  . Hydrocodone-Acetaminophen     nausea  . Vicodin [Hydrocodone-Acetaminophen] Nausea Only     Discussed warning signs or symptoms. Please see discharge instructions. Patient expresses understanding.

## 2017-12-19 ENCOUNTER — Telehealth: Payer: Self-pay | Admitting: Family Medicine

## 2017-12-19 NOTE — Telephone Encounter (Signed)
I tried to call Howard Fritz to clarify when his return to work date is if he in fact has returned to work for his short-term disability claim.  Unable to leave a message.  Please contact patient and ask when his return to work date is if he has returned to work.  If he is not able to return to work let me know.  I will complete short-term disability form

## 2017-12-19 NOTE — Telephone Encounter (Signed)
Attempted to contact Pt on both numbers listed - no answer and no VM

## 2017-12-19 NOTE — Telephone Encounter (Signed)
Pt came and collected form.   Copy made by Dr Denyse Amass to be sent to scan

## 2017-12-19 NOTE — Telephone Encounter (Signed)
He has not returned to work. He expects to return on 12/24/17.

## 2018-01-02 ENCOUNTER — Other Ambulatory Visit: Payer: Self-pay | Admitting: Family Medicine

## 2018-01-06 MED ORDER — BUTALBITAL-APAP-CAFFEINE 50-325-40 MG PO TABS: 1.0000 | ORAL_TABLET | Freq: Four times a day (QID) | ORAL | 0 refills | Status: AC | PRN

## 2018-01-06 MED ORDER — SUMATRIPTAN SUCCINATE 50 MG PO TABS: 50.0000 mg | ORAL_TABLET | ORAL | 0 refills | Status: DC | PRN

## 2018-01-07 ENCOUNTER — Ambulatory Visit: Payer: BLUE CROSS/BLUE SHIELD | Admitting: Family Medicine

## 2018-07-08 ENCOUNTER — Telehealth: Payer: Self-pay | Admitting: Family Medicine

## 2018-07-08 ENCOUNTER — Other Ambulatory Visit: Payer: Self-pay

## 2018-07-08 ENCOUNTER — Ambulatory Visit (INDEPENDENT_AMBULATORY_CARE_PROVIDER_SITE_OTHER): Payer: BLUE CROSS/BLUE SHIELD | Admitting: Family Medicine

## 2018-07-08 ENCOUNTER — Encounter: Payer: Self-pay | Admitting: Family Medicine

## 2018-07-08 VITALS — Temp 98.7°F | Wt 192.0 lb

## 2018-07-08 DIAGNOSIS — R51 Headache: Secondary | ICD-10-CM | POA: Diagnosis not present

## 2018-07-08 DIAGNOSIS — R0982 Postnasal drip: Secondary | ICD-10-CM

## 2018-07-08 DIAGNOSIS — R519 Headache, unspecified: Secondary | ICD-10-CM

## 2018-07-08 DIAGNOSIS — G8929 Other chronic pain: Secondary | ICD-10-CM

## 2018-07-08 MED ORDER — FLUTICASONE PROPIONATE 50 MCG/ACT NA SUSP: 2.0000 | Freq: Every day | NASAL | 2 refills | Status: DC

## 2018-07-08 MED ORDER — BENZONATATE 200 MG PO CAPS: 200.0000 mg | ORAL_CAPSULE | Freq: Three times a day (TID) | ORAL | 0 refills | Status: DC | PRN

## 2018-07-08 MED ORDER — AZITHROMYCIN 250 MG PO TABS: 250.0000 mg | ORAL_TABLET | Freq: Every day | ORAL | 0 refills | Status: DC

## 2018-07-08 MED ORDER — TOPIRAMATE 50 MG PO TABS: 50.0000 mg | ORAL_TABLET | Freq: Every day | ORAL | 3 refills | Status: DC

## 2018-07-08 MED ORDER — GUAIFENESIN-CODEINE 100-10 MG/5ML PO SOLN: 5.0000 mL | Freq: Four times a day (QID) | ORAL | 0 refills | Status: DC | PRN

## 2018-07-08 MED ORDER — AZELASTINE HCL 0.1 % NA SOLN: 2.0000 | Freq: Two times a day (BID) | NASAL | 12 refills | Status: DC

## 2018-07-08 NOTE — Telephone Encounter (Signed)
Received fax from [facility].  Jerilynn Som are on back order.  We will send in codeine cough syrup.  Talked with patient.  He can tolerate codeine better than he can tolerate hydrocodone. PDMP reviewed during this encounter.

## 2018-07-08 NOTE — Patient Instructions (Addendum)
Thank you for coming in today.  Zyrtec D, or allegra D or Claritin D (generic ones).  Use the astelin nasal spray and use the Flonase nasal spray.   Recheck with me as needed.   For cough use prescription tessalon pearles and over the counter muccinex DM and menthol containing cough drops.   Take the zpack if worsening.    Suspected COVID-19 self isolation guidelines.   Please self isolate for at least 3 days (72 hours) have passed since recovery defined as resolution of fever without the use of fever-reducing medications and improvement in respiratory symptoms (e.g., cough, shortness of breath), and at least 7 days have passed since symptoms first appeared.   You do not need a negative test to document recovery.  Close contacts of a person with known or suspected COVID-19 should self-monitor their temperature and symptoms of COVID-19, limit outside interaction as much as possible for 14 days, and self-isolate if they develop symptoms.   Medications & Home Remedies for Respiratory Illness   Typically safe to take these things together, assuming normal liver/kidney function, no other drug interactions, no pregnancy. Please call us or consult with the pharmacist if you have any questions!   Aches/Pains, Fever, Headache OTC Acetaminophen (Tylenol) pain reliever/fever reducer OTC Ibuprofen (Motrin) NSAID anti-inflammatory, fever reducer*   Sinus Congestion OTC Nasal Saline if desired to rinse OTC Oxymetolazone (Afrin, others) sparing use due to rebound congestion, NEVER use in kids OTC Phenylephrine (Sudafed) decongestant* OTC Diphenhydramine (Benadryl) antihistamine (will dry out mucus)   Cough & Sore Throat OTC Dextromethorphan (Robitussin, others) - cough suppressant OTC Guaifenesin (Robitussin, Mucinex, others) - expectorant (helps cough up mucus) (Dextromethorphan and Guaifenesin also come in a combination tablet/syrup) OTC Lozenges w/ Benzocaine + Menthol (Cepacol) OTC cough  drops with menthol. Sugar free if able.  Honey - as needed. Avoid if diabetic. Teas which "coat the throat" - look for ingredients Elm Bark, Licorice Root, Marshmallow Root   Other OTC Zinc Lozenges within 24 hours of symptoms onset - mixed evidence this shortens the duration of the common cold Don't waste your money on Vitamin C or Echinacea in acute illness - it's already too late!   *caution in patients with high blood pressure

## 2018-07-08 NOTE — Progress Notes (Addendum)
Virtual Visit  via Video or Phone Note  I connected with      Howard Fritz  by a telemedicine application and verified that I am speaking with the correct person using two identifiers.   I discussed the limitations of evaluation and management by telemedicine and the availability of in person appointments. The patient expressed understanding and agreed to proceed.  History of Present Illness: Howard Fritz is a 33 y.o. male who would like to discuss nasal congestion.  Howard Fritz notes nasal congestion for about 2 weeks.  He is been trying NyQuil and DayQuil which help a bit.  He notes he has a lot of postnasal drainage especially lays down at night.  This causes a cough.  The cough is productive.  He notes his symptoms are somewhat similar to sinus infections that he gets typically in the spring.  He notes the cough is interfering with sleep.  He is not tried specific cough medicine yet however.  He is not tried allergy medications either.  He denies itchy watery eyes.  He denies any sick contacts.  He denies any recent travel.  He denies any contact with known or suspected COVID-19.   No fevers chills body aches or myalgias.   Additionally he notes that he continues to take Topamax for migraine prophylaxis and finds it to be very helpful.   Observations/Objective: Temp 98.7 F (37.1 C)   Wt 192 lb (87.1 kg)   BMI 28.35 kg/m  Wt Readings from Last 5 Encounters:  07/08/18 192 lb (87.1 kg)  12/17/17 190 lb 6.4 oz (86.4 kg)  12/15/17 185 lb (83.9 kg)  06/18/17 188 lb (85.3 kg)  03/05/17 188 lb 14.4 oz (85.7 kg)   Exam: Normal Speech.  No hoarseness shortness of breath stridor or wheeze. Psych alert and oriented normal speech thought process and affect.  Lab and Radiology Results No results found for this or any previous visit (from the past 72 hour(s)). No results found.   Assessment and Plan: 33 y.o. male with  Postnasal drainage associated with sinus congestion.   This is very likely allergies versus viral URI.  COVID-19 is quite unlikely as he does not have other systemic signs or symptoms.  Recommend limited self-isolation.  Provided instructions per CDC guidelines.  Treat empirically with Tessalon Perles Astelin nasal spray Flonase nasal spray.  Prescribe backup azithromycin that he will take if he worsens.  Additionally refilled Topamax for migraine prophylaxis.  He notes this is quite helpful.  PDMP not reviewed this encounter. No orders of the defined types were placed in this encounter.  Meds ordered this encounter  Medications  . topiramate (TOPAMAX) 50 MG tablet    Sig: Take 1 tablet (50 mg total) by mouth daily.    Dispense:  90 tablet    Refill:  3  . benzonatate (TESSALON) 200 MG capsule    Sig: Take 1 capsule (200 mg total) by mouth 3 (three) times daily as needed for cough.    Dispense:  45 capsule    Refill:  0  . azelastine (ASTELIN) 0.1 % nasal spray    Sig: Place 2 sprays into both nostrils 2 (two) times daily. Use in each nostril as directed    Dispense:  30 mL    Refill:  12  . fluticasone (FLONASE) 50 MCG/ACT nasal spray    Sig: Place 2 sprays into both nostrils daily.    Dispense:  16 g    Refill:  2  .  azithromycin (ZITHROMAX) 250 MG tablet    Sig: Take 1 tablet (250 mg total) by mouth daily. Take first 2 tablets together, then 1 every day until finished.    Dispense:  6 tablet    Refill:  0    Follow Up Instructions:    I discussed the assessment and treatment plan with the patient. The patient was provided an opportunity to ask questions and all were answered. The patient agreed with the plan and demonstrated an understanding of the instructions.   The patient was advised to call back or seek an in-person evaluation if the symptoms worsen or if the condition fails to improve as anticipated.  I provided 22 minutes of non-face-to-face time during this encounter.    Historical information moved to improve  visibility of documentation.  Past Medical History:  Diagnosis Date  . Allergic rhinitis 02/11/2013   No past surgical history on file. Social History   Tobacco Use  . Smoking status: Current Every Day Smoker    Types: Cigarettes  . Smokeless tobacco: Never Used  Substance Use Topics  . Alcohol use: No   family history is not on file.  Medications: Current Outpatient Medications  Medication Sig Dispense Refill  . azelastine (ASTELIN) 0.1 % nasal spray Place 2 sprays into both nostrils 2 (two) times daily. Use in each nostril as directed 30 mL 12  . azithromycin (ZITHROMAX) 250 MG tablet Take 1 tablet (250 mg total) by mouth daily. Take first 2 tablets together, then 1 every day until finished. 6 tablet 0  . benzonatate (TESSALON) 200 MG capsule Take 1 capsule (200 mg total) by mouth 3 (three) times daily as needed for cough. 45 capsule 0  . butalbital-acetaminophen-caffeine (FIORICET, ESGIC) 50-325-40 MG tablet Take 1-2 tablets by mouth every 6 (six) hours as needed for headache. 20 tablet 0  . fluticasone (FLONASE) 50 MCG/ACT nasal spray Place 2 sprays into both nostrils daily. 16 g 2  . SUMAtriptan (IMITREX) 50 MG tablet Take 1 tablet (50 mg total) by mouth every 2 (two) hours as needed for migraine. May repeat in 2 hours if headache persists or recurs. 30 tablet 0  . tamsulosin (FLOMAX) 0.4 MG CAPS capsule TAKE 1 CAPSULE BY MOUTH EVERY DAY 30 capsule 0  . topiramate (TOPAMAX) 50 MG tablet Take 1 tablet (50 mg total) by mouth daily. 90 tablet 3   No current facility-administered medications for this visit.    Allergies  Allergen Reactions  . Hydrocodone-Acetaminophen     nausea  . Vicodin [Hydrocodone-Acetaminophen] Nausea Only    PDMP not reviewed this encounter. No orders of the defined types were placed in this encounter.  Meds ordered this encounter  Medications  . topiramate (TOPAMAX) 50 MG tablet    Sig: Take 1 tablet (50 mg total) by mouth daily.    Dispense:  90  tablet    Refill:  3  . benzonatate (TESSALON) 200 MG capsule    Sig: Take 1 capsule (200 mg total) by mouth 3 (three) times daily as needed for cough.    Dispense:  45 capsule    Refill:  0  . azelastine (ASTELIN) 0.1 % nasal spray    Sig: Place 2 sprays into both nostrils 2 (two) times daily. Use in each nostril as directed    Dispense:  30 mL    Refill:  12  . fluticasone (FLONASE) 50 MCG/ACT nasal spray    Sig: Place 2 sprays into both nostrils daily.  Dispense:  16 g    Refill:  2  . azithromycin (ZITHROMAX) 250 MG tablet    Sig: Take 1 tablet (250 mg total) by mouth daily. Take first 2 tablets together, then 1 every day until finished.    Dispense:  6 tablet    Refill:  0                                                                 Addendum to correct incorrect date due to templating error

## 2019-10-07 ENCOUNTER — Other Ambulatory Visit: Payer: Self-pay

## 2019-10-07 ENCOUNTER — Emergency Department (INDEPENDENT_AMBULATORY_CARE_PROVIDER_SITE_OTHER)
Admission: EM | Admit: 2019-10-07 | Discharge: 2019-10-07 | Disposition: A | Discharge status: Home / Self Care | Payer: Medicaid Other | Source: Home / Self Care

## 2019-10-07 ENCOUNTER — Encounter: Payer: Self-pay | Admitting: Emergency Medicine

## 2019-10-07 DIAGNOSIS — L0501 Pilonidal cyst with abscess: Secondary | ICD-10-CM

## 2019-10-07 MED ORDER — ONDANSETRON HCL 4 MG PO TABS
4.0000 mg | ORAL_TABLET | Freq: Four times a day (QID) | ORAL | 0 refills | Status: DC
Start: 2019-10-07 — End: 2020-04-17

## 2019-10-07 MED ORDER — TRAMADOL HCL 50 MG PO TABS: 50.0000 mg | ORAL_TABLET | Freq: Four times a day (QID) | ORAL | 0 refills | Status: DC | PRN

## 2019-10-07 MED ORDER — SULFAMETHOXAZOLE-TRIMETHOPRIM 800-160 MG PO TABS: 1.0000 | ORAL_TABLET | Freq: Two times a day (BID) | ORAL | 0 refills | Status: AC

## 2019-10-07 NOTE — Discharge Instructions (Signed)
  Please keep bandage in place until follow up tomorrow afternoon for packing removal or possible packing change.  Take your medication as prescribed.  Norco/Vicodin (hydrocodone-acetaminophen) is a narcotic pain medication, do not combine these medications with others containing tylenol. While taking, do not drink alcohol, drive, or perform any other activities that requires focus while taking these medications.   You may want to take 800mg  ibuprofen to 1 hour prior to arrival to help with discomfort during packing change.

## 2019-10-07 NOTE — ED Provider Notes (Signed)
[facility]    CSN: [id number] Arrival date & time: 10/07/19  1831      History   Chief Complaint Chief Complaint  Patient presents with  . Abscess    HPI Howard Fritz is a 34 y.o. male.   HPI  Howard Fritz is a 34 y.o. male presenting to UC with c/o 1-2 weeks of gradually worsening pain redness and swelling on top Left of gluteal cleft where he has had a small cyst for about 15 years.  He has only had mild episode of pain once before. He has never needed I&D.  Denies fever but has had some nausea from the pain. Pain is 7/10, worse when pressure applied to the area. 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   Past Medical History:  Diagnosis Date  . Allergic rhinitis 02/11/2013    Patient Active Problem List   Diagnosis Date Noted  . Kidney stone 06/18/2017  . Insomnia 05/11/2015  . Allergic rhinitis 02/11/2013  . Chronic headache 01/22/2012  . Current tobacco use 01/22/2012    History reviewed. No pertinent surgical history.     Home Medications    Prior to Admission medications   Medication Sig Start Date End Date Taking? Authorizing Provider  famotidine (PEPCID) 10 MG tablet Take 10 mg by mouth 2 (two) times daily.   Yes [provider]  ibuprofen (ADVIL) 200 MG tablet Take 200 mg by mouth every 6 (six) hours as needed.   Yes [provider]  ondansetron (ZOFRAN) 4 MG tablet Take 1 tablet (4 mg total) by mouth every 6 (six) hours. 10/07/19   Lurene Shadow, PA-C  sulfamethoxazole-trimethoprim (BACTRIM DS) 800-160 MG tablet Take 1 tablet by mouth 2 (two) times daily for 7 days. 10/07/19 10/14/19  Lurene Shadow, PA-C  traMADol (ULTRAM) 50 MG tablet Take 1 tablet (50 mg total) by mouth every 6 (six) hours as needed. 10/07/19   Lurene Shadow, PA-C    Family History Family History  Problem Relation Age of Onset  . Healthy Mother   . Diabetes Father     Social History Social History    Tobacco Use  . Smoking status: Current Every Day Smoker    Types: Cigarettes  . Smokeless tobacco: Never Used  Vaping Use  . Vaping Use: Never used  Substance Use Topics  . Alcohol use: No  . Drug use: No     Allergies   Hydrocodone-acetaminophen and Vicodin [hydrocodone-acetaminophen]   Review of Systems Review of Systems  Constitutional: Negative for chills and fever.  Gastrointestinal: Positive for nausea. Negative for diarrhea and vomiting.  Skin: Positive for color change. Negative for wound.     Physical Exam Triage Vital Signs ED Triage Vitals [10/07/19 1853]  Enc Vitals Group     BP 135/89     Pulse Rate 72     Resp      Temp 99.3 F (37.4 C)     Temp Source Oral     SpO2 97 %     Weight 185 lb (83.9 kg)     Height 5\' 10"  (1.778 m)     Head Circumference      Peak Flow      Pain Score 7     Pain Loc      Pain Edu?      Excl. in GC?    No data found.  Updated Vital Signs BP 135/89 (  BP Location: Right Arm)   Pulse 72   Temp 99.3 F (37.4 C) (Oral)   Ht 5\' 10"  (1.778 m)   Wt 185 lb (83.9 kg)   SpO2 97%   BMI 26.54 kg/m   Visual Acuity Right Eye Distance:   Left Eye Distance:   Bilateral Distance:    Right Eye Near:   Left Eye Near:    Bilateral Near:     Physical Exam Vitals and nursing note reviewed. Exam conducted with a chaperone present.  Constitutional:      Appearance: Normal appearance. He is well-developed.  HENT:     Head: Normocephalic and atraumatic.  Cardiovascular:     Rate and Rhythm: Normal rate.  Pulmonary:     Effort: Pulmonary effort is normal.  Musculoskeletal:        General: Normal range of motion.     Cervical back: Normal range of motion.  Skin:    General: Skin is warm and dry.     Findings: Erythema present.       Neurological:     Mental Status: He is alert and oriented to person, place, and time.  Psychiatric:        Behavior: Behavior normal.      UC Treatments / Results  Labs (all labs  ordered are listed, but only abnormal results are displayed) Labs Reviewed  WOUND CULTURE    EKG   Radiology No results found.  Procedures Incision and Drainage  Date/Time: 10/07/2019 7:50 PM Performed by: 10/09/2019, PA-C Authorized by: Lurene Shadow, PA-C   Consent:    Consent obtained:  Verbal   Consent given by:  Patient   Risks discussed:  Bleeding, incomplete drainage, pain, infection and damage to other organs   Alternatives discussed:  Delayed treatment and no treatment Location:    Type:  Pilonidal cyst   Size:  4cm   Location:  Anogenital   Anogenital location:  Gluteal cleft (left side) Procedure type:    Complexity:  Complex Procedure details:    Incision types:  Single straight   Incision depth:  Dermal   Scalpel blade:  11   Wound management:  Probed and deloculated   Drainage:  Bloody and purulent   Drainage amount:  Moderate   Wound treatment:  Wound left open   Packing materials:  1/4 in iodoform gauze   Amount 1/4" iodoform:  1in Post-procedure details:    Patient tolerance of procedure:  Tolerated well, no immediate complications   (including critical care time)  Medications Ordered in UC Medications - No data to display  Initial Impression / Assessment and Plan / UC Course  I have reviewed the triage vital signs and the nursing notes.  Pertinent labs & imaging results that were available during my care of the patient were reviewed by me and considered in my medical decision making (see chart for details).     Keep packing in place until dressing change tomorrow Start on Bactrim tonight Wound culture pending. AVS given along with work note  Final Clinical Impressions(s) / UC Diagnoses   Final diagnoses:  Pilonidal abscess     Discharge Instructions      Please keep bandage in place until follow up tomorrow afternoon for packing removal or possible packing change.  Take your medication as prescribed.  Norco/Vicodin  (hydrocodone-acetaminophen) is a narcotic pain medication, do not combine these medications with others containing tylenol. While taking, do not drink alcohol, drive, or perform any other  activities that requires focus while taking these medications.   You may want to take 800mg  ibuprofen 68min to 1 hour prior to arrival to help with discomfort during packing change.     ED Prescriptions    Medication Sig Dispense Auth. Provider   traMADol (ULTRAM) 50 MG tablet Take 1 tablet (50 mg total) by mouth every 6 (six) hours as needed. 15 tablet Gerarda Fraction, Dyon Rotert O, PA-C   sulfamethoxazole-trimethoprim (BACTRIM DS) 800-160 MG tablet Take 1 tablet by mouth 2 (two) times daily for 7 days. 14 tablet Renesmee Raine O, PA-C   ondansetron (ZOFRAN) 4 MG tablet Take 1 tablet (4 mg total) by mouth every 6 (six) hours. 12 tablet Noe Gens, [place]     I have reviewed the PDMP during this encounter.   Noe Gens, [place] 10/07/19 1952

## 2019-10-07 NOTE — ED Triage Notes (Signed)
Abscess x 15 years worse 2 weeks

## 2019-10-08 ENCOUNTER — Other Ambulatory Visit: Payer: Self-pay

## 2019-10-08 ENCOUNTER — Emergency Department (INDEPENDENT_AMBULATORY_CARE_PROVIDER_SITE_OTHER)
Admission: EM | Admit: 2019-10-08 | Discharge: 2019-10-08 | Disposition: A | Discharge status: Home / Self Care | Payer: Medicaid Other | Source: Home / Self Care | Attending: Family Medicine | Admitting: Family Medicine

## 2019-10-08 DIAGNOSIS — Z4801 Encounter for change or removal of surgical wound dressing: Secondary | ICD-10-CM

## 2019-10-08 NOTE — Discharge Instructions (Addendum)
Increase fluid intake.  Change bandage daily until healed.

## 2019-10-08 NOTE — ED Provider Notes (Signed)
[facility]    CSN: [id number] Arrival date & time: 10/08/19  1425      History   Chief Complaint Chief Complaint  Patient presents with   Wound Infection    f/up from yesterday    HPI Howard Fritz is a 34 y.o. male.   Patient returns for follow-up of I and D of pilonidal cyst yesterday.  He reports a significant reduction in pain, and no fevers, chills, and sweats;  The history is provided by the patient.    Past Medical History:  Diagnosis Date   Allergic rhinitis 02/11/2013    Patient Active Problem List   Diagnosis Date Noted   Kidney stone 06/18/2017   Insomnia 05/11/2015   Allergic rhinitis 02/11/2013   Chronic headache 01/22/2012   Current tobacco use 01/22/2012    History reviewed. No pertinent surgical history.     Home Medications    Prior to Admission medications   Medication Sig Start Date End Date Taking? Authorizing Provider  famotidine (PEPCID) 10 MG tablet Take 10 mg by mouth 2 (two) times daily.    [provider]  ibuprofen (ADVIL) 200 MG tablet Take 200 mg by mouth every 6 (six) hours as needed.    [provider]  ondansetron (ZOFRAN) 4 MG tablet Take 1 tablet (4 mg total) by mouth every 6 (six) hours. 10/07/19   Lurene Shadow, PA-C  sulfamethoxazole-trimethoprim (BACTRIM DS) 800-160 MG tablet Take 1 tablet by mouth 2 (two) times daily for 7 days. 10/07/19 10/14/19  Lurene Shadow, PA-C  traMADol (ULTRAM) 50 MG tablet Take 1 tablet (50 mg total) by mouth every 6 (six) hours as needed. 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  Skin: Positive for wound.  All other systems reviewed and are negative.    Physical Exam Triage Vital Signs ED Triage Vitals  Enc Vitals Group     BP 10/08/19 1435 108/66     Pulse Rate 10/08/19 1435 74     Resp 10/08/19 1435 18     Temp 10/08/19 1435 98.1 F (36.7 C)     Temp Source 10/08/19 1435 Oral     SpO2 10/08/19 1435 97 %     Weight 10/08/19 1434 182 lb 15.7 oz (83 kg)     Height 10/08/19 1434 5\' 10"  (1.778 m)     Head Circumference --      Peak Flow --      Pain Score 10/08/19 1434 4     Pain Loc --      Pain Edu? --      Excl. in GC? --    No data found.  Updated Vital Signs BP 108/66 (BP Location: Right Arm)    Pulse 74    Temp 98.1 F (36.7 C) (Oral)    Resp 18    Ht 5\' 10"  (1.778  m)    Wt 83 kg    SpO2 97%    BMI 26.26 kg/m   Visual Acuity Right Eye Distance:   Left Eye Distance:   Bilateral Distance:    Right Eye Near:   Left Eye Near:    Bilateral Near:     Physical Exam Nursing notes and Vital Signs reviewed. Appearance:  Patient appears stated age, and in no acute distress I and D site superior aspect of gluteal cleft;  Mild erythema without swelling and minimal tenderness to palpation.  Removed packing from wound:  Minimal purulent drainage.  Wound shallow and no indication for additional packing.  Bandage applied.  UC Treatments / Results  Labs (all labs ordered are listed, but only abnormal results are displayed) Labs Reviewed - No data to display  EKG   Radiology No results found.  Procedures Procedures (including critical care time)  Medications Ordered in UC Medications - No data to display  Initial Impression / Assessment and Plan / UC Course  I have reviewed the triage vital signs and the nursing notes.  Pertinent labs &  imaging results that were available during my care of the patient were reviewed by me and considered in my medical decision making (see chart for details).    Cellulitis improving.  Finish antibiotic.   Final Clinical Impressions(s) / UC Diagnoses   Final diagnoses:  Dressing change or removal, surgical wound     Discharge Instructions     Increase fluid intake.  Change bandage daily until healed.    ED Prescriptions    None        Kandra Nicolas, MD 10/10/19 1340

## 2019-10-08 NOTE — ED Triage Notes (Signed)
Follow up from yesterday cyst packing. Reports feeling pain from incision. Took prescribed Tramadol around 8:30am.

## 2019-10-10 LAB — WOUND CULTURE
MICRO NUMBER:: 10633660
SPECIMEN QUALITY:: ADEQUATE

## 2020-04-17 ENCOUNTER — Telehealth: Payer: Medicaid Other | Admitting: Family

## 2020-04-17 DIAGNOSIS — R112 Nausea with vomiting, unspecified: Secondary | ICD-10-CM | POA: Diagnosis not present

## 2020-04-17 MED ORDER — ONDANSETRON HCL 4 MG PO TABS: 4.0000 mg | ORAL_TABLET | Freq: Three times a day (TID) | ORAL | 0 refills | Status: DC | PRN

## 2020-04-17 NOTE — Progress Notes (Signed)

## 2020-10-11 ENCOUNTER — Other Ambulatory Visit: Payer: Self-pay

## 2020-10-11 ENCOUNTER — Emergency Department (INDEPENDENT_AMBULATORY_CARE_PROVIDER_SITE_OTHER)
Admission: EM | Admit: 2020-10-11 | Discharge: 2020-10-11 | Disposition: A | Discharge status: Home / Self Care | Payer: Medicaid Other | Source: Home / Self Care

## 2020-10-11 DIAGNOSIS — J3489 Other specified disorders of nose and nasal sinuses: Secondary | ICD-10-CM

## 2020-10-11 DIAGNOSIS — J309 Allergic rhinitis, unspecified: Secondary | ICD-10-CM

## 2020-10-11 DIAGNOSIS — R059 Cough, unspecified: Secondary | ICD-10-CM

## 2020-10-11 DIAGNOSIS — B9689 Other specified bacterial agents as the cause of diseases classified elsewhere: Secondary | ICD-10-CM

## 2020-10-11 MED ORDER — FEXOFENADINE HCL 180 MG PO TABS
180.0000 mg | ORAL_TABLET | Freq: Every day | ORAL | 0 refills | Status: DC
Start: 2020-10-11 — End: 2021-06-08

## 2020-10-11 MED ORDER — PREDNISONE 20 MG PO TABS
ORAL_TABLET | ORAL | 0 refills | Status: DC
Start: 2020-10-11 — End: 2021-06-08

## 2020-10-11 MED ORDER — AMOXICILLIN-POT CLAVULANATE 875-125 MG PO TABS: 1.0000 | ORAL_TABLET | Freq: Two times a day (BID) | ORAL | 0 refills | Status: AC

## 2020-10-11 NOTE — Discharge Instructions (Addendum)
Advised patient to take medication as directed with food to completion.  Advised/encouraged patient to take Allegra 180 mg daily for the next 5 days for concurrent postnasal drainage, then may take as needed.

## 2020-10-11 NOTE — ED Triage Notes (Signed)
Pt presents to Urgent Care with c/o fatigue, body aches, cough, nasal congestion, and "cold sweats" x 2 days. Reports 3 negative home COVID tests; pt unvaccinated against COVID and flu--but had COVID in November 2021. Afebrile.

## 2020-10-11 NOTE — ED Provider Notes (Signed)
[name]    CSN: [id number] Arrival date & time: 10/11/20  1451      History   Chief Complaint Chief Complaint  Patient presents with   Generalized Body Aches   Cough   Nasal Congestion    HPI Howard Fritz is a 35 y.o. male.   HPI 35 year old male presents with fatigue, body aches, cough, nasal congestion and cold sweats x2 days.  Reports 3 negative home COVID-19 test, reports contracting COVID-19 in November 2021.  Patient is unvaccinated for COVID-19.  Past Medical History:  Diagnosis Date   Allergic rhinitis 02/11/2013    Patient Active Problem List   Diagnosis Date Noted   Kidney stone 06/18/2017   Insomnia 05/11/2015   Allergic rhinitis 02/11/2013   Chronic headache 01/22/2012   Current tobacco use 01/22/2012    History reviewed. No pertinent surgical history.     Home Medications    Prior to Admission medications   Medication Sig Start Date End Date Taking? Authorizing Provider  amoxicillin-clavulanate (AUGMENTIN) 875-125 MG tablet Take 1 tablet by mouth every 12 (twelve) hours for 5 days. 10/11/20 10/16/20 Yes Trevor Iha, FNP  fexofenadine [facility] ALLERGY) 180 MG tablet Take 1 tablet (180 mg total) by mouth daily for 15 days. 10/11/20 10/26/20 Yes Trevor Iha, FNP  predniSONE (DELTASONE) 20 MG tablet Take 3 tabs PO daily x 5 days. 10/11/20  Yes Trevor Iha, FNP  pseudoephedrine-guaifenesin (MUCINEX D) 60-600 MG 12 hr tablet Take 1 tablet by mouth every 12 (twelve) hours.   Yes [provider]  famotidine (PEPCID) 10 MG tablet Take 10 mg by mouth 2 (two) times daily.    [provider]  ibuprofen (ADVIL) 200 MG tablet Take 200 mg by mouth every 6 (six) hours as needed.    [provider]  ondansetron (ZOFRAN) 4 MG tablet Take 1 tablet (4 mg total) by mouth every 8 (eight) hours as needed for nausea or vomiting. 04/17/20   Junie Spencer, FNP  traMADol (ULTRAM) 50 MG tablet Take 1 tablet (50 mg total) by  mouth every 6 (six) hours as needed. 10/07/19   Lurene Shadow, PA-C    Family History Family History  Problem Relation Age of Onset   Healthy Mother    Diabetes Father     Social History Social History   Tobacco Use   Smoking status: Every Day    Packs/day: 0.50    Years: 15.00    Pack years: 7.50    Types: Cigarettes   Smokeless tobacco: Never  Vaping Use   Vaping Use: Never used  Substance Use Topics   Alcohol use: No   Drug use: No     Allergies   Hydrocodone-acetaminophen and Vicodin [hydrocodone-acetaminophen]   Review of Systems Review of Systems  Constitutional:  Positive for chills and fatigue.       Cold sweats x 2 days  HENT:  Positive for congestion.   Respiratory:  Positive for cough.   All other systems reviewed and are negative.   Physical Exam Triage Vital Signs ED Triage Vitals  Enc Vitals Group     BP 10/11/20 1511 127/81     Pulse Rate 10/11/20 1511 80     Resp 10/11/20 1511 20     Temp 10/11/20 1511 98.6 F (37 C)     Temp Source 10/11/20 1511 Oral     SpO2 10/11/20 1511 98 %     Weight 10/11/20 1507 180 lb (81.6 kg)  Height 10/11/20 1507 5\' 10"  (1.778 m)     Head Circumference --      Peak Flow --      Pain Score 10/11/20 1507 3     Pain Loc --      Pain Edu? --      Excl. in GC? --    No data found.  Updated Vital Signs BP 127/81 (BP Location: Right Arm)   Pulse 80   Temp 98.6 F (37 C) (Oral)   Resp 20   Ht 5\' 10"  (1.778 m)   Wt 180 lb (81.6 kg)   SpO2 98%   BMI 25.83 kg/m   Physical Exam Vitals and nursing note reviewed.  Constitutional:      General: He is not in acute distress.    Appearance: Normal appearance. He is normal weight. He is not ill-appearing.  HENT:     Head: Normocephalic and atraumatic.     Right Ear: Tympanic membrane, ear canal and external ear normal.     Left Ear: Tympanic membrane, ear canal and external ear normal.     Nose:     Right Turbinates: Enlarged.     Left Turbinates:  Enlarged.     Right Sinus: Maxillary sinus tenderness present.     Left Sinus: Maxillary sinus tenderness present.     Comments: Turbinates are erythematous    Mouth/Throat:     Lips: Pink.     Pharynx: Oropharynx is clear. Uvula midline.     Comments: Moderate amount of clear drainage of posterior oropharynx noted Eyes:     Extraocular Movements: Extraocular movements intact.     Conjunctiva/sclera: Conjunctivae normal.     Pupils: Pupils are equal, round, and reactive to light.  Cardiovascular:     Rate and Rhythm: Regular rhythm. Bradycardia present.     Pulses: Normal pulses.     Heart sounds: Normal heart sounds.  Pulmonary:     Effort: Pulmonary effort is normal. No respiratory distress.     Breath sounds: Normal breath sounds. No wheezing, rhonchi or rales.     Comments: Infrequent nonproductive cough noted on exam Musculoskeletal:        General: Normal range of motion.     Cervical back: Normal range of motion and neck supple. No tenderness.  Lymphadenopathy:     Cervical: Cervical adenopathy present.  Skin:    General: Skin is warm and dry.  Neurological:     General: No focal deficit present.     Mental Status: He is alert and oriented to person, place, and time.  Psychiatric:        Mood and Affect: Mood normal.        Behavior: Behavior normal.     UC Treatments / Results  Labs (all labs ordered are listed, but only abnormal results are displayed) Labs Reviewed  COVID-19, FLU A+B NAA    EKG   Radiology No results found.  Procedures Procedures (including critical care time)  Medications Ordered in UC Medications - No data to display  Initial Impression / Assessment and Plan / UC Course  I have reviewed the triage vital signs and the nursing notes.  Pertinent labs & imaging results that were available during my care of the patient were reviewed by me and considered in my medical decision making (see chart for details).     MDM: 1.  Cough, 2.   Acute bacterial rhinosinusitis (Rx'd Augmentin x 5 days), 3.  Sinus pressure (Rx'd prednisone x5  days), 4.  Allergic rhinitis.  COVID-19/flu AB ordered for chills/myalgias and unvaccinated COVID-19 status.  Discharged home, hemodynamically stable. Final Clinical Impressions(s) / UC Diagnoses   Final diagnoses:  Cough  Acute bacterial rhinosinusitis  Sinus pressure  Allergic rhinitis, unspecified seasonality, unspecified trigger     Discharge Instructions      Advised patient to take medication as directed with food to completion.  Advised/encouraged patient to take Allegra 180 mg daily for the next 5 days for concurrent postnasal drainage, then may take as needed.     ED Prescriptions     Medication Sig Dispense Auth. Provider   amoxicillin-clavulanate (AUGMENTIN) 875-125 MG tablet Take 1 tablet by mouth every 12 (twelve) hours for 5 days. 10 tablet Trevor Iha, FNP   predniSONE (DELTASONE) 20 MG tablet Take 3 tabs PO daily x 5 days. 15 tablet Trevor Iha, FNP   fexofenadine [facility] ALLERGY) 180 MG tablet Take 1 tablet (180 mg total) by mouth daily for 15 days. 15 tablet Trevor Iha, FNP      PDMP not reviewed this encounter.   Trevor Iha, FNP 10/11/20 1606

## 2020-10-13 LAB — COVID-19, FLU A+B NAA
Influenza A, NAA: NOT DETECTED
Influenza B, NAA: NOT DETECTED
SARS-CoV-2, NAA: NOT DETECTED

## 2021-06-08 ENCOUNTER — Other Ambulatory Visit: Payer: Self-pay

## 2021-06-08 ENCOUNTER — Ambulatory Visit (INDEPENDENT_AMBULATORY_CARE_PROVIDER_SITE_OTHER): Payer: BLUE CROSS/BLUE SHIELD | Admitting: Sports Medicine

## 2021-06-08 VITALS — BP 134/85 | HR 78 | Wt 196.1 lb

## 2021-06-08 DIAGNOSIS — Z13220 Encounter for screening for lipoid disorders: Secondary | ICD-10-CM | POA: Diagnosis not present

## 2021-06-08 DIAGNOSIS — F411 Generalized anxiety disorder: Secondary | ICD-10-CM | POA: Diagnosis not present

## 2021-06-08 DIAGNOSIS — G43709 Chronic migraine without aura, not intractable, without status migrainosus: Secondary | ICD-10-CM | POA: Diagnosis not present

## 2021-06-08 DIAGNOSIS — Z720 Tobacco use: Secondary | ICD-10-CM

## 2021-06-08 DIAGNOSIS — R5383 Other fatigue: Secondary | ICD-10-CM | POA: Diagnosis not present

## 2021-06-08 DIAGNOSIS — Z0000 Encounter for general adult medical examination without abnormal findings: Secondary | ICD-10-CM | POA: Insufficient documentation

## 2021-06-08 MED ORDER — TOPIRAMATE 50 MG PO TABS: ORAL_TABLET | ORAL | 3 refills | Status: AC

## 2021-06-08 MED ORDER — SERTRALINE HCL 25 MG PO TABS: 25.0000 mg | ORAL_TABLET | Freq: Every day | ORAL | 2 refills | Status: DC

## 2021-06-08 MED ORDER — RIZATRIPTAN BENZOATE 5 MG PO TBDP: 5.0000 mg | ORAL_TABLET | ORAL | 3 refills | Status: DC | PRN

## 2021-06-08 NOTE — Progress Notes (Signed)
° ° °  Procedures performed today:    None.  Independent interpretation of notes and tests performed by another provider:   None.  Brief History, Exam, Impression, and Recommendations:    Generalized anxiety disorder This is a pleasant 36 year old male, he has noted a change in his mood, he has lost desire to talk to friends, wife, he does experience some anhedonia, agoraphobia, its interfering with his activities of daily living and his interpersonal relationships. He scored high on the PHQ-9 and the GAD-7. Adding Zoloft 25 with a 4-week dose titration.  Migraine headache Chia also endorses frequent headaches, he describes them as bitemporal, throbbing, he does get some photophobia without phonophobia, when they are bad they can last all day and, with nausea. They do sound like migraines, historically this has been prevented with topiramate, unsure why it was discontinued. Restarting topiramate and adding Maxalt low-dose for migraine abortion. We will do a 4-week follow-up.  Current tobacco use Kentrail is a smoker, I think right now is probably not the best time to get him off of cigarettes, we will try to get control of her the mood disorder before adding in the complexity of smoking cessation. It does sound like he tried Chantix in the past and had some nausea.    ___________________________________________ [name]. Benjamin Stain, M.D., ABFM., CAQSM. Primary Care and Sports Medicine [facility]  Adjunct Instructor of Family Medicine  [employer]

## 2021-06-08 NOTE — Assessment & Plan Note (Signed)
Howard Fritz is a smoker, I think right now is probably not the best time to get him off of cigarettes, we will try to get control of her the mood disorder before adding in the complexity of smoking cessation. It does sound like he tried Chantix in the past and had some nausea.

## 2021-06-08 NOTE — Assessment & Plan Note (Signed)
This is a pleasant 36 year old male, he has noted a change in his mood, he has lost desire to talk to friends, wife, he does experience some anhedonia, agoraphobia, its interfering with his activities of daily living and his interpersonal relationships. He scored high on the PHQ-9 and the GAD-7. Adding Zoloft 25 with a 4-week dose titration.

## 2021-06-08 NOTE — Assessment & Plan Note (Signed)
Howard Fritz also endorses frequent headaches, he describes them as bitemporal, throbbing, he does get some photophobia without phonophobia, when they are bad they can last all day and, with nausea. They do sound like migraines, historically this has been prevented with topiramate, unsure why it was discontinued. Restarting topiramate and adding Maxalt low-dose for migraine abortion. We will do a 4-week follow-up.

## 2021-06-12 LAB — CBC
HCT: 45.2 % (ref 38.5–50.0)
Hemoglobin: 15.4 g/dL (ref 13.2–17.1)
MCH: 32.4 pg (ref 27.0–33.0)
MCHC: 34.1 g/dL (ref 32.0–36.0)
MCV: 95 fL (ref 80.0–100.0)
MPV: 12.4 fL (ref 7.5–12.5)
Platelets: 313 10*3/uL (ref 140–400)
RBC: 4.76 10*6/uL (ref 4.20–5.80)
RDW: 12.2 % (ref 11.0–15.0)
WBC: 10 10*3/uL (ref 3.8–10.8)

## 2021-06-12 LAB — LIPID PANEL
Cholesterol: 166 mg/dL (ref <200)
HDL: 32 mg/dL — ABNORMAL LOW (ref >=40)
LDL Cholesterol (Calc): 102 mg/dL (calc) — ABNORMAL HIGH
Non-HDL Cholesterol (Calc): 134 mg/dL (calc) — ABNORMAL HIGH (ref <130)
Total CHOL/HDL Ratio: 5.2 (calc) — ABNORMAL HIGH (ref <5.0)
Triglycerides: 198 mg/dL — ABNORMAL HIGH (ref <150)

## 2021-06-12 LAB — COMPREHENSIVE METABOLIC PANEL
AG Ratio: 1.9 (calc) (ref 1.0–2.5)
ALT: 19 U/L (ref 9–46)
AST: 16 U/L (ref 10–40)
Albumin: 4.4 g/dL (ref 3.6–5.1)
Alkaline phosphatase (APISO): 67 U/L (ref 36–130)
BUN: 12 mg/dL (ref 7–25)
CO2: 26 mmol/L (ref 20–32)
Calcium: 9.3 mg/dL (ref 8.6–10.3)
Chloride: 106 mmol/L (ref 98–110)
Creat: 0.92 mg/dL (ref 0.60–1.26)
Globulin: 2.3 g/dL (calc) (ref 1.9–3.7)
Glucose, Bld: 89 mg/dL (ref 65–139)
Potassium: 4.1 mmol/L (ref 3.5–5.3)
Sodium: 140 mmol/L (ref 135–146)
Total Bilirubin: 0.2 mg/dL (ref 0.2–1.2)
Total Protein: 6.7 g/dL (ref 6.1–8.1)

## 2021-06-12 LAB — TESTOSTERONE, FREE & TOTAL
Free Testosterone: 43.1 pg/mL (ref 35.0–155.0)
Testosterone, Total, LC-MS-MS: 311 ng/dL (ref 250–1100)

## 2021-06-12 LAB — TSH: TSH: 1.72 mIU/L (ref 0.40–4.50)

## 2021-07-05 ENCOUNTER — Other Ambulatory Visit: Payer: Self-pay | Admitting: Sports Medicine

## 2021-07-05 DIAGNOSIS — F411 Generalized anxiety disorder: Secondary | ICD-10-CM

## 2021-07-06 ENCOUNTER — Ambulatory Visit: Payer: Medicaid Other | Admitting: Sports Medicine

## 2021-07-11 ENCOUNTER — Ambulatory Visit (INDEPENDENT_AMBULATORY_CARE_PROVIDER_SITE_OTHER): Payer: BLUE CROSS/BLUE SHIELD | Admitting: Sports Medicine

## 2021-07-11 DIAGNOSIS — G43709 Chronic migraine without aura, not intractable, without status migrainosus: Secondary | ICD-10-CM

## 2021-07-11 DIAGNOSIS — F411 Generalized anxiety disorder: Secondary | ICD-10-CM | POA: Diagnosis not present

## 2021-07-11 MED ORDER — RIZATRIPTAN BENZOATE 10 MG PO TBDP: 10.0000 mg | ORAL_TABLET | ORAL | 11 refills | Status: AC | PRN

## 2021-07-11 NOTE — Assessment & Plan Note (Signed)
Howard Fritz had frequent bitemporal throbbing headaches, some with photophobia and phonophobia, debilitating. ?We added Maxalt and topiramate, he has done extremely well, he has only had a single headache, he did try the Maxalt and it was partially effective so we will go ahead and bump him up to 10 mg. ?It has resolved his anxiety and depression. ?No change in topiramate dosing. ?

## 2021-07-11 NOTE — Assessment & Plan Note (Signed)
Symptoms resolved with migraine treatment, has not needed sertraline. ?

## 2021-07-11 NOTE — Progress Notes (Signed)
? ? ?  Procedures performed today:   ? ?None. ? ?Independent interpretation of notes and tests performed by another provider:  ? ?None. ? ?Brief History, Exam, Impression, and Recommendations:   ? ?Generalized anxiety disorder ?Symptoms resolved with migraine treatment, has not needed sertraline. ? ?Migraine headache ?Howard Fritz had frequent bitemporal throbbing headaches, some with photophobia and phonophobia, debilitating. ?We added Maxalt and topiramate, he has done extremely well, he has only had a single headache, he did try the Maxalt and it was partially effective so we will go ahead and bump him up to 10 mg. ?It has resolved his anxiety and depression. ?No change in topiramate dosing. ? ? ? ?___________________________________________ ?[name]. Benjamin Stain, M.D., ABFM., CAQSM. ?Primary Care and Sports Medicine ? [name] ? ?Adjunct Instructor of Family Medicine  ?[employer] ?

## 2021-12-18 ENCOUNTER — Ambulatory Visit
Admission: EM | Admit: 2021-12-18 | Discharge: 2021-12-18 | Disposition: A | Discharge status: Home / Self Care | Payer: BLUE CROSS/BLUE SHIELD | Attending: Family Medicine | Admitting: Family Medicine

## 2021-12-18 DIAGNOSIS — F17210 Nicotine dependence, cigarettes, uncomplicated: Secondary | ICD-10-CM | POA: Insufficient documentation

## 2021-12-18 DIAGNOSIS — J309 Allergic rhinitis, unspecified: Secondary | ICD-10-CM | POA: Insufficient documentation

## 2021-12-18 DIAGNOSIS — R519 Headache, unspecified: Secondary | ICD-10-CM | POA: Insufficient documentation

## 2021-12-18 DIAGNOSIS — Z20822 Contact with and (suspected) exposure to covid-19: Secondary | ICD-10-CM | POA: Insufficient documentation

## 2021-12-18 DIAGNOSIS — J3489 Other specified disorders of nose and nasal sinuses: Secondary | ICD-10-CM

## 2021-12-18 DIAGNOSIS — J0100 Acute maxillary sinusitis, unspecified: Secondary | ICD-10-CM | POA: Insufficient documentation

## 2021-12-18 DIAGNOSIS — E669 Obesity, unspecified: Secondary | ICD-10-CM | POA: Insufficient documentation

## 2021-12-18 DIAGNOSIS — R059 Cough, unspecified: Secondary | ICD-10-CM | POA: Insufficient documentation

## 2021-12-18 LAB — SARS CORONAVIRUS 2 BY RT PCR: SARS Coronavirus 2 by RT PCR: NEGATIVE

## 2021-12-18 MED ORDER — FEXOFENADINE HCL 180 MG PO TABS: 180.0000 mg | ORAL_TABLET | Freq: Every day | ORAL | 0 refills | Status: AC

## 2021-12-18 MED ORDER — METHYLPREDNISOLONE SODIUM SUCC 125 MG IJ SOLR
125.0000 mg | Freq: Once | INTRAMUSCULAR | Status: AC
  Administered 2021-12-18: 125 mg via INTRAMUSCULAR

## 2021-12-18 MED ORDER — PREDNISONE 20 MG PO TABS: ORAL_TABLET | ORAL | 0 refills | Status: AC

## 2021-12-18 MED ORDER — AMOXICILLIN-POT CLAVULANATE 875-125 MG PO TABS: 1.0000 | ORAL_TABLET | Freq: Two times a day (BID) | ORAL | 0 refills | Status: AC

## 2021-12-18 MED ORDER — BENZONATATE 200 MG PO CAPS: 200.0000 mg | ORAL_CAPSULE | Freq: Three times a day (TID) | ORAL | 0 refills | Status: AC | PRN

## 2021-12-18 NOTE — Discharge Instructions (Addendum)
Advised patient to take medication as directed with food to completion.  Advised patient to take prednisone and Allegra with first dose of Augmentin for the next 5 of 7 days.  Advised may use Allegra as needed afterwards for concurrent postnasal drainage/drip.  Advised may use Tessalon Perles daily or as needed for cough.  Encouraged patient to increase daily water intake while taking these medications.  Advised if symptoms worsen and/or unresolved please follow-up with PCP or here for further evaluation.  Advised we will follow-up with lab results once received.

## 2021-12-18 NOTE — ED Triage Notes (Signed)
Pt presents with c/o HA, body aches, congestion, and cough that began Friday

## 2021-12-18 NOTE — ED Provider Notes (Signed)
[name]    CSN: [id number] Arrival date & time: 12/18/21  0804      History   Chief Complaint Chief Complaint  Patient presents with   Cough   Nasal Congestion   Headache   Generalized Body Aches    HPI Howard Fritz is a 36 y.o. male.   HPI 36 year old male presents with headache, body aches, congestion and cough that began Friday, 12/14/21.  PMH significant for obesity, current tobacco use, and kidney stone.  Past Medical History:  Diagnosis Date   Allergic rhinitis 02/11/2013    Patient Active Problem List   Diagnosis Date Noted   Annual physical exam 06/08/2021   Generalized anxiety disorder 06/08/2021   Kidney stone 06/18/2017   Allergic rhinitis 02/11/2013   Migraine headache 01/22/2012   Current tobacco use 01/22/2012    History reviewed. No pertinent surgical history.     Home Medications    Prior to Admission medications   Medication Sig Start Date End Date Taking? Authorizing Provider  amoxicillin-clavulanate (AUGMENTIN) 875-125 MG tablet Take 1 tablet by mouth every 12 (twelve) hours. 12/18/21  Yes Trevor Iha, FNP  benzonatate (TESSALON) 200 MG capsule Take 1 capsule (200 mg total) by mouth 3 (three) times daily as needed for up to 7 days. 12/18/21 12/25/21 Yes Trevor Iha, FNP  fexofenadine [facility] ALLERGY) 180 MG tablet Take 1 tablet (180 mg total) by mouth daily for 15 days. 12/18/21 01/02/22 Yes Trevor Iha, FNP  predniSONE (DELTASONE) 20 MG tablet Take 3 tabs PO daily x 5 days. 12/18/21  Yes Trevor Iha, FNP  famotidine (PEPCID) 10 MG tablet Take 10 mg by mouth 2 (two) times daily.    [provider]  rizatriptan (MAXALT-MLT) 10 MG disintegrating tablet Take 1 tablet (10 mg total) by mouth as needed for migraine. May repeat in 2 hours if needed 07/11/21   Monica Becton, MD  topiramate (TOPAMAX) 50 MG tablet One half tab by mouth nightly for one week then one tab by mouth nightly. 06/08/21   Monica Becton, MD    Family History Family History  Problem Relation Age of Onset   Healthy Mother    Diabetes Father     Social History Social History   Tobacco Use   Smoking status: Every Day    Packs/day: 0.50    Years: 15.00    Total pack years: 7.50    Types: Cigarettes   Smokeless tobacco: Never  Vaping Use   Vaping Use: Never used  Substance Use Topics   Alcohol use: No   Drug use: No     Allergies   Hydrocodone-acetaminophen and Vicodin [hydrocodone-acetaminophen]   Review of Systems Review of Systems  HENT:  Positive for congestion.   Musculoskeletal:  Positive for myalgias.  Neurological:  Positive for headaches.  All other systems reviewed and are negative.    Physical Exam Triage Vital Signs ED Triage Vitals  Enc Vitals Group     BP 12/18/21 0814 129/81     Pulse Rate 12/18/21 0814 79     Resp 12/18/21 0814 14     Temp 12/18/21 0814 98.9 F (37.2 C)     Temp Source 12/18/21 0814 Oral     SpO2 12/18/21 0814 97 %     Weight --      Height --      Head Circumference --      Peak Flow --      Pain Score 12/18/21 0813 8  Pain Loc --      Pain Edu? --      Excl. in GC? --    No data found.  Updated Vital Signs BP 129/81 (BP Location: Right Arm)   Pulse 79   Temp 98.9 F (37.2 C) (Oral)   Resp 14   SpO2 97%     Physical Exam Vitals and nursing note reviewed.  Constitutional:      General: He is not in acute distress.    Appearance: Normal appearance. He is normal weight. He is ill-appearing.  HENT:     Head: Normocephalic and atraumatic.     Right Ear: Tympanic membrane and external ear normal.     Left Ear: Tympanic membrane and external ear normal.     Ears:     Comments: Significant eustachian tube dysfunction noted bilaterally    Nose:     Right Sinus: Maxillary sinus tenderness present.     Left Sinus: Maxillary sinus tenderness present.     Comments: Turbinates are erythematous/edematous    Mouth/Throat:     Mouth: Mucous  membranes are moist.     Pharynx: Oropharynx is clear.     Comments: Significant amount of clear drainage of posterior oropharynx noted Eyes:     Extraocular Movements: Extraocular movements intact.     Conjunctiva/sclera: Conjunctivae normal.     Pupils: Pupils are equal, round, and reactive to light.  Cardiovascular:     Rate and Rhythm: Normal rate and regular rhythm.     Pulses: Normal pulses.     Heart sounds: Normal heart sounds.  Pulmonary:     Effort: Pulmonary effort is normal.     Breath sounds: Normal breath sounds. No wheezing, rhonchi or rales.     Comments: Frequent nonproductive cough noted on exam Musculoskeletal:        General: Normal range of motion.     Cervical back: Normal range of motion and neck supple. No tenderness.  Lymphadenopathy:     Cervical: No cervical adenopathy.  Skin:    General: Skin is warm and dry.  Neurological:     General: No focal deficit present.     Mental Status: He is alert and oriented to person, place, and time. Mental status is at baseline.      UC Treatments / Results  Labs (all labs ordered are listed, but only abnormal results are displayed) Labs Reviewed  SARS CORONAVIRUS 2 BY RT PCR    EKG   Radiology No results found.  Procedures Procedures (including critical care time)  Medications Ordered in UC Medications  methylPREDNISolone sodium succinate (SOLU-MEDROL) 125 mg/2 mL injection 125 mg (125 mg Intramuscular Given 12/18/21 0855)    Initial Impression / Assessment and Plan / UC Course  I have reviewed the triage vital signs and the nursing notes.  Pertinent labs & imaging results that were available during my care of the patient were reviewed by me and considered in my medical decision making (see chart for details).     MDM: 1.  Acute maxillary sinusitis recurrence not specified-Rx'd Augmentin; 2.  Sinus pressure-IM Solu-Medrol 125 mg given once in clinic, Rx'd prednisone; 3.  Cough-Rx'd Tessalon Perles;  4.  Allergic rhinitis-Rx'd Allegra. Advised patient to take prednisone and Allegra with first dose of Augmentin for the next 5 of 7 days.  Advised may use Allegra as needed afterwards for concurrent postnasal drainage/drip.  Advised may use Tessalon Perles daily or as needed for cough.  Encouraged patient to increase  daily water intake while taking these medications.  Advised if symptoms worsen and/or unresolved please follow-up with PCP or here for further evaluation.  Advised we will follow-up with lab results once received.  Discharged home, hemodynamically stable. Final Clinical Impressions(s) / UC Diagnoses   Final diagnoses:  Acute maxillary sinusitis, recurrence not specified  Sinus pressure  Cough, unspecified type  Allergic rhinitis, unspecified seasonality, unspecified trigger     Discharge Instructions      Advised patient to take medication as directed with food to completion.  Advised patient to take prednisone and Allegra with first dose of Augmentin for the next 5 of 7 days.  Advised may use Allegra as needed afterwards for concurrent postnasal drainage/drip.  Advised may use Tessalon Perles daily or as needed for cough.  Encouraged patient to increase daily water intake while taking these medications.  Advised if symptoms worsen and/or unresolved please follow-up with PCP or here for further evaluation.  Advised we will follow-up with lab results once received.     ED Prescriptions     Medication Sig Dispense Auth. Provider   amoxicillin-clavulanate (AUGMENTIN) 875-125 MG tablet Take 1 tablet by mouth every 12 (twelve) hours. 14 tablet Trevor Iha, FNP   predniSONE (DELTASONE) 20 MG tablet Take 3 tabs PO daily x 5 days. 15 tablet Trevor Iha, FNP   fexofenadine [facility] ALLERGY) 180 MG tablet Take 1 tablet (180 mg total) by mouth daily for 15 days. 15 tablet Trevor Iha, FNP   benzonatate (TESSALON) 200 MG capsule Take 1 capsule (200 mg total) by mouth 3 (three)  times daily as needed for up to 7 days. 40 capsule Trevor Iha, FNP      PDMP not reviewed this encounter.   Trevor Iha, FNP 12/18/21 [id number]

## 2021-12-19 ENCOUNTER — Telehealth: Payer: Self-pay

## 2021-12-19 NOTE — Telephone Encounter (Signed)
TC to f/u after yesterday's visit to [facility]. No answer; left VM to call [phone] for problems or questions. 

## 2023-12-16 ENCOUNTER — Encounter: Payer: Self-pay | Admitting: Sports Medicine
